# Patient Record
Sex: Female | Born: 1995 | Race: White | Hispanic: No | Marital: Single | Smoking: Never smoker
Health system: Southern US, Community
[De-identification: ages and names within clinical notes are randomized; demographics above are authoritative.]

## PROBLEM LIST (undated history)

## (undated) ENCOUNTER — Inpatient Hospital Stay (HOSPITAL_COMMUNITY): Payer: Self-pay

## (undated) DIAGNOSIS — R011 Cardiac murmur, unspecified: Secondary | ICD-10-CM

## (undated) HISTORY — DX: Cardiac murmur, unspecified: R01.1

## (undated) HISTORY — PX: NO PAST SURGERIES: SHX2092

---
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
## 2017-01-07 ENCOUNTER — Ambulatory Visit (INDEPENDENT_AMBULATORY_CARE_PROVIDER_SITE_OTHER): Payer: Medicaid Other | Admitting: Obstetrics & Gynecology

## 2017-01-07 VITALS — BP 119/72 | HR 97 | Wt 187.0 lb

## 2017-01-07 DIAGNOSIS — Z3402 Encounter for supervision of normal first pregnancy, second trimester: Secondary | ICD-10-CM

## 2017-01-07 DIAGNOSIS — Z23 Encounter for immunization: Secondary | ICD-10-CM | POA: Diagnosis not present

## 2017-01-07 DIAGNOSIS — Z3403 Encounter for supervision of normal first pregnancy, third trimester: Secondary | ICD-10-CM

## 2017-01-07 NOTE — Progress Notes (Signed)
   PRENATAL VISIT NOTE  Subjective:  Stephanie Simon is a 21 y.o. G1P0 at [redacted]w[redacted]d being seen today for ongoing prenatal care.  She is currently monitored for the following issues for this low-risk pregnancy and has Encounter for supervision of normal first pregnancy; Late prenatal care affecting pregnancy in second trimester; and Supervision of normal first teen pregnancy on her problem list.  Patient reports no complaints.  Contractions: Not present. Vag. Bleeding: None.  Movement: Present. Denies leaking of fluid.   The following portions of the patient's history were reviewed and updated as appropriate: allergies, current medications, past family history, past medical history, past social history, past surgical history and problem list. Problem list updated.  Objective:   Vitals:   01/07/17 0857  BP: 119/72  Pulse: 97  Weight: 187 lb (84.8 kg)    Fetal Status: Fetal Heart Rate (bpm): 145   Movement: Present     General:  Alert, oriented and cooperative. Patient is in no acute distress.  Skin: Skin is warm and dry. No rash noted.   Cardiovascular: Normal heart rate noted  Respiratory: Normal respiratory effort, no problems with respiration noted  Abdomen: Soft, gravid, appropriate for gestational age.  Pain/Pressure: Present     Pelvic: Cervical exam deferred        Extremities: Normal range of motion.  Edema: None  Mental Status:  Normal mood and affect. Normal behavior. Normal judgment and thought content.   Assessment and Plan:  Pregnancy: G1P0 at [redacted]w[redacted]d  1. Needs flu shot  - Flu Vaccine QUAD 36+ mos IM  2. Supervision of normal first teen pregnancy in second trimester   3. Encounter for supervision of normal first pregnancy in third trimester   Preterm labor symptoms and general obstetric precautions including but not limited to vaginal bleeding, contractions, leaking of fluid and fetal movement were reviewed in detail with the patient. Please refer to After Visit Summary  for other counseling recommendations.  Return in about 2 weeks (around 01/21/2017).   Allie Bossier, MD

## 2017-02-04 ENCOUNTER — Other Ambulatory Visit (HOSPITAL_COMMUNITY)
Admission: RE | Admit: 2017-02-04 | Discharge: 2017-02-04 | Disposition: A | Discharge status: Home / Self Care | Payer: Medicaid Other | Source: Ambulatory Visit | Attending: Family Medicine | Admitting: Family Medicine

## 2017-02-04 ENCOUNTER — Ambulatory Visit (INDEPENDENT_AMBULATORY_CARE_PROVIDER_SITE_OTHER): Payer: Medicaid Other | Admitting: Family Medicine

## 2017-02-04 VITALS — BP 120/80 | HR 108 | Wt 195.0 lb

## 2017-02-04 DIAGNOSIS — Z3403 Encounter for supervision of normal first pregnancy, third trimester: Secondary | ICD-10-CM | POA: Insufficient documentation

## 2017-02-04 LAB — OB RESULTS CONSOLE GBS: STREP GROUP B AG: NEGATIVE

## 2017-02-04 LAB — OB RESULTS CONSOLE GC/CHLAMYDIA: GC PROBE AMP, GENITAL: NEGATIVE

## 2017-02-04 NOTE — Patient Instructions (Signed)
Breastfeeding Deciding to breastfeed is one of the best choices you can make for you and your baby. A change in hormones during pregnancy causes your breast tissue to grow and increases the number and size of your milk ducts. These hormones also allow proteins, sugars, and fats from your blood supply to make breast milk in your milk-producing glands. Hormones prevent breast milk from being released before your baby is born as well as prompt milk flow after birth. Once breastfeeding has begun, thoughts of your baby, as well as his or her sucking or crying, can stimulate the release of milk from your milk-producing glands. Benefits of breastfeeding For Your Baby  Your first milk (colostrum) helps your baby's digestive system function better.  There are antibodies in your milk that help your baby fight off infections.  Your baby has a lower incidence of asthma, allergies, and sudden infant death syndrome.  The nutrients in breast milk are better for your baby than infant formulas and are designed uniquely for your baby's needs.  Breast milk improves your baby's brain development.  Your baby is less likely to develop other conditions, such as childhood obesity, asthma, or type 2 diabetes mellitus.  For You  Breastfeeding helps to create a very special bond between you and your baby.  Breastfeeding is convenient. Breast milk is always available at the correct temperature and costs nothing.  Breastfeeding helps to burn calories and helps you lose the weight gained during pregnancy.  Breastfeeding makes your uterus contract to its prepregnancy size faster and slows bleeding (lochia) after you give birth.  Breastfeeding helps to lower your risk of developing type 2 diabetes mellitus, osteoporosis, and breast or ovarian cancer later in life.  Signs that your baby is hungry Early Signs of Hunger  Increased alertness or activity.  Stretching.  Movement of the head from side to  side.  Movement of the head and opening of the mouth when the corner of the mouth or cheek is stroked (rooting).  Increased sucking sounds, smacking lips, cooing, sighing, or squeaking.  Hand-to-mouth movements.  Increased sucking of fingers or hands.  Late Signs of Hunger  Fussing.  Intermittent crying.  Extreme Signs of Hunger Signs of extreme hunger will require calming and consoling before your baby will be able to breastfeed successfully. Do not wait for the following signs of extreme hunger to occur before you initiate breastfeeding:  Restlessness.  A loud, strong cry.  Screaming.  Breastfeeding basics Breastfeeding Initiation  Find a comfortable place to sit or lie down, with your neck and back well supported.  Place a pillow or rolled up blanket under your baby to bring him or her to the level of your breast (if you are seated). Nursing pillows are specially designed to help support your arms and your baby while you breastfeed.  Make sure that your baby's abdomen is facing your abdomen.  Gently massage your breast. With your fingertips, massage from your chest wall toward your nipple in a circular motion. This encourages milk flow. You may need to continue this action during the feeding if your milk flows slowly.  Support your breast with 4 fingers underneath and your thumb above your nipple. Make sure your fingers are well away from your nipple and your baby's mouth.  Stroke your baby's lips gently with your finger or nipple.  When your baby's mouth is open wide enough, quickly bring your baby to your breast, placing your entire nipple and as much of the colored area   around your nipple (areola) as possible into your baby's mouth. ? More areola should be visible above your baby's upper lip than below the lower lip. ? Your baby's tongue should be between his or her lower gum and your breast.  Ensure that your baby's mouth is correctly positioned around your nipple  (latched). Your baby's lips should create a seal on your breast and be turned out (everted).  It is common for your baby to suck about 2-3 minutes in order to start the flow of breast milk.  Latching Teaching your baby how to latch on to your breast properly is very important. An improper latch can cause nipple pain and decreased milk supply for you and poor weight gain in your baby. Also, if your baby is not latched onto your nipple properly, he or she may swallow some air during feeding. This can make your baby fussy. Burping your baby when you switch breasts during the feeding can help to get rid of the air. However, teaching your baby to latch on properly is still the best way to prevent fussiness from swallowing air while breastfeeding. Signs that your baby has successfully latched on to your nipple:  Silent tugging or silent sucking, without causing you pain.  Swallowing heard between every 3-4 sucks.  Muscle movement above and in front of his or her ears while sucking.  Signs that your baby has not successfully latched on to nipple:  Sucking sounds or smacking sounds from your baby while breastfeeding.  Nipple pain.  If you think your baby has not latched on correctly, slip your finger into the corner of your baby's mouth to break the suction and place it between your baby's gums. Attempt breastfeeding initiation again. Signs of Successful Breastfeeding Signs from your baby:  A gradual decrease in the number of sucks or complete cessation of sucking.  Falling asleep.  Relaxation of his or her body.  Retention of a small amount of milk in his or her mouth.  Letting go of your breast by himself or herself.  Signs from you:  Breasts that have increased in firmness, weight, and size 1-3 hours after feeding.  Breasts that are softer immediately after breastfeeding.  Increased milk volume, as well as a change in milk consistency and color by the fifth day of  breastfeeding.  Nipples that are not sore, cracked, or bleeding.  Signs That Your Baby is Getting Enough Milk  Wetting at least 1-2 diapers during the first 24 hours after birth.  Wetting at least 5-6 diapers every 24 hours for the first week after birth. The urine should be clear or pale yellow by 5 days after birth.  Wetting 6-8 diapers every 24 hours as your baby continues to grow and develop.  At least 3 stools in a 24-hour period by age 5 days. The stool should be soft and yellow.  At least 3 stools in a 24-hour period by age 7 days. The stool should be seedy and yellow.  No loss of weight greater than 10% of birth weight during the first 3 days of age.  Average weight gain of 4-7 ounces (113-198 g) per week after age 4 days.  Consistent daily weight gain by age 5 days, without weight loss after the age of 2 weeks.  After a feeding, your baby may spit up a small amount. This is common. Breastfeeding frequency and duration Frequent feeding will help you make more milk and can prevent sore nipples and breast engorgement. Breastfeed when   you feel the need to reduce the fullness of your breasts or when your baby shows signs of hunger. This is called "breastfeeding on demand." Avoid introducing a pacifier to your baby while you are working to establish breastfeeding (the first 4-6 weeks after your baby is born). After this time you may choose to use a pacifier. Research has shown that pacifier use during the first year of a baby's life decreases the risk of sudden infant death syndrome (SIDS). Allow your baby to feed on each breast as long as he or she wants. Breastfeed until your baby is finished feeding. When your baby unlatches or falls asleep while feeding from the first breast, offer the second breast. Because newborns are often sleepy in the first few weeks of life, you may need to awaken your baby to get him or her to feed. Breastfeeding times will vary from baby to baby. However,  the following rules can serve as a guide to help you ensure that your baby is properly fed:  Newborns (babies 4 weeks of age or younger) may breastfeed every 1-3 hours.  Newborns should not go longer than 3 hours during the day or 5 hours during the night without breastfeeding.  You should breastfeed your baby a minimum of 8 times in a 24-hour period until you begin to introduce solid foods to your baby at around 6 months of age.  Breast milk pumping Pumping and storing breast milk allows you to ensure that your baby is exclusively fed your breast milk, even at times when you are unable to breastfeed. This is especially important if you are going back to work while you are still breastfeeding or when you are not able to be present during feedings. Your lactation consultant can give you guidelines on how long it is safe to store breast milk. A breast pump is a machine that allows you to pump milk from your breast into a sterile bottle. The pumped breast milk can then be stored in a refrigerator or freezer. Some breast pumps are operated by hand, while others use electricity. Ask your lactation consultant which type will work best for you. Breast pumps can be purchased, but some hospitals and breastfeeding support groups lease breast pumps on a monthly basis. A lactation consultant can teach you how to hand express breast milk, if you prefer not to use a pump. Caring for your breasts while you breastfeed Nipples can become dry, cracked, and sore while breastfeeding. The following recommendations can help keep your breasts moisturized and healthy:  Avoid using soap on your nipples.  Wear a supportive bra. Although not required, special nursing bras and tank tops are designed to allow access to your breasts for breastfeeding without taking off your entire bra or top. Avoid wearing underwire-style bras or extremely tight bras.  Air dry your nipples for 3-4minutes after each feeding.  Use only cotton  bra pads to absorb leaked breast milk. Leaking of breast milk between feedings is normal.  Use lanolin on your nipples after breastfeeding. Lanolin helps to maintain your skin's normal moisture barrier. If you use pure lanolin, you do not need to wash it off before feeding your baby again. Pure lanolin is not toxic to your baby. You may also hand express a few drops of breast milk and gently massage that milk into your nipples and allow the milk to air dry.  In the first few weeks after giving birth, some women experience extremely full breasts (engorgement). Engorgement can make your   breasts feel heavy, warm, and tender to the touch. Engorgement peaks within 3-5 days after you give birth. The following recommendations can help ease engorgement:  Completely empty your breasts while breastfeeding or pumping. You may want to start by applying warm, moist heat (in the shower or with warm water-soaked hand towels) just before feeding or pumping. This increases circulation and helps the milk flow. If your baby does not completely empty your breasts while breastfeeding, pump any extra milk after he or she is finished.  Wear a snug bra (nursing or regular) or tank top for 1-2 days to signal your body to slightly decrease milk production.  Apply ice packs to your breasts, unless this is too uncomfortable for you.  Make sure that your baby is latched on and positioned properly while breastfeeding.  If engorgement persists after 48 hours of following these recommendations, contact your health care provider or a lactation consultant. Overall health care recommendations while breastfeeding  Eat healthy foods. Alternate between meals and snacks, eating 3 of each per day. Because what you eat affects your breast milk, some of the foods may make your baby more irritable than usual. Avoid eating these foods if you are sure that they are negatively affecting your baby.  Drink milk, fruit juice, and water to  satisfy your thirst (about 10 glasses a day).  Rest often, relax, and continue to take your prenatal vitamins to prevent fatigue, stress, and anemia.  Continue breast self-awareness checks.  Avoid chewing and smoking tobacco. Chemicals from cigarettes that pass into breast milk and exposure to secondhand smoke may harm your baby.  Avoid alcohol and drug use, including marijuana. Some medicines that may be harmful to your baby can pass through breast milk. It is important to ask your health care provider before taking any medicine, including all over-the-counter and prescription medicine as well as vitamin and herbal supplements. It is possible to become pregnant while breastfeeding. If birth control is desired, ask your health care provider about options that will be safe for your baby. Contact a health care provider if:  You feel like you want to stop breastfeeding or have become frustrated with breastfeeding.  You have painful breasts or nipples.  Your nipples are cracked or bleeding.  Your breasts are red, tender, or warm.  You have a swollen area on either breast.  You have a fever or chills.  You have nausea or vomiting.  You have drainage other than breast milk from your nipples.  Your breasts do not become full before feedings by the fifth day after you give birth.  You feel sad and depressed.  Your baby is too sleepy to eat well.  Your baby is having trouble sleeping.  Your baby is wetting less than 3 diapers in a 24-hour period.  Your baby has less than 3 stools in a 24-hour period.  Your baby's skin or the white part of his or her eyes becomes yellow.  Your baby is not gaining weight by 5 days of age. Get help right away if:  Your baby is overly tired (lethargic) and does not want to wake up and feed.  Your baby develops an unexplained fever. This information is not intended to replace advice given to you by your health care provider. Make sure you discuss  any questions you have with your health care provider. Document Released: 03/31/2005 Document Revised: 09/12/2015 Document Reviewed: 09/22/2012 Elsevier Interactive Patient Education  2017 [employer]  

## 2017-02-04 NOTE — Progress Notes (Signed)
   PRENATAL VISIT NOTE  Subjective:  Stephanie KirschnerMaiya Simon is a 21 y.o. G1P0 at 4756w2d being seen today for ongoing prenatal care.  She is currently monitored for the following issues for this low-risk pregnancy and has Encounter for supervision of normal first pregnancy; Late prenatal care affecting pregnancy in second trimester; and Supervision of normal first teen pregnancy on her problem list.  Patient reports no complaints.  Contractions: Not present.  .  Movement: Present. Denies leaking of fluid.   The following portions of the patient's history were reviewed and updated as appropriate: allergies, current medications, past family history, past medical history, past social history, past surgical history and problem list. Problem list updated.  Objective:   Vitals:   02/04/17 1006  BP: 120/80  Pulse: (!) 108  Weight: 195 lb (88.5 kg)    Fetal Status: Fetal Heart Rate (bpm): 151 Fundal Height: 37 cm Movement: Present  Presentation: Vertex  General:  Alert, oriented and cooperative. Patient is in no acute distress.  Skin: Skin is warm and dry. No rash noted.   Cardiovascular: Normal heart rate noted  Respiratory: Normal respiratory effort, no problems with respiration noted  Abdomen: Soft, gravid, appropriate for gestational age.  Pain/Pressure: Absent     Pelvic: Cervical exam performed Dilation: Closed Effacement (%): 50 Station: -2  Extremities: Normal range of motion.  Edema: None  Mental Status:  Normal mood and affect. Normal behavior. Normal judgment and thought content.   Assessment and Plan:  Pregnancy: G1P0 at 5756w2d  1. Supervision of normal first teen pregnancy in third trimester Cultures today Labor precautions reviewed - Culture, beta strep (group b only) - GC[facility]Chlamydia [facility]   Term labor symptoms and general obstetric precautions including but not limited to vaginal bleeding, contractions, leaking of fluid and fetal movement were reviewed in  detail with the patient. Please refer to After Visit Summary for other counseling recommendations.  Return in 1 week (on 02/11/2017).   Reva Boresanya S Nichael Ehly, MD

## 2017-02-05 LAB — GC/CHLAMYDIA PROBE AMP (~~LOC~~) NOT AT ARMC
CHLAMYDIA, DNA PROBE: NEGATIVE
Neisseria Gonorrhea: NEGATIVE

## 2017-02-08 LAB — CULTURE, BETA STREP (GROUP B ONLY): STREP GP B CULTURE: NEGATIVE

## 2017-02-11 ENCOUNTER — Ambulatory Visit (INDEPENDENT_AMBULATORY_CARE_PROVIDER_SITE_OTHER): Payer: Medicaid Other | Admitting: Family Medicine

## 2017-02-11 VITALS — BP 109/70 | HR 97 | Wt 199.4 lb

## 2017-02-11 DIAGNOSIS — O0933 Supervision of pregnancy with insufficient antenatal care, third trimester: Secondary | ICD-10-CM

## 2017-02-11 DIAGNOSIS — Z3403 Encounter for supervision of normal first pregnancy, third trimester: Secondary | ICD-10-CM

## 2017-02-11 DIAGNOSIS — O0932 Supervision of pregnancy with insufficient antenatal care, second trimester: Secondary | ICD-10-CM

## 2017-02-11 NOTE — Progress Notes (Signed)
   PRENATAL VISIT NOTE  Subjective:  Stephanie Simon is a 21 y.o. G1P0 at 6439w2d being seen today for ongoing prenatal care.  She is currently monitored for the following issues for this low-risk pregnancy and has Encounter for supervision of normal first pregnancy; Late prenatal care affecting pregnancy in second trimester; and Supervision of normal first teen pregnancy on her problem list.  Patient reports no complaints.  Contractions: Irritability. Vag. Bleeding: None.  Movement: Present. Denies leaking of fluid.   The following portions of the patient's history were reviewed and updated as appropriate: allergies, current medications, past family history, past medical history, past social history, past surgical history and problem list. Problem list updated.  Objective:   Vitals:   02/11/17 1346  BP: 109/70  Pulse: 97  Weight: 199 lb 6.4 oz (90.4 kg)    Fetal Status: Fetal Heart Rate (bpm): 135 Fundal Height: 39 cm Movement: Present  Presentation: Vertex  General:  Alert, oriented and cooperative. Patient is in no acute distress.  Skin: Skin is warm and dry. No rash noted.   Cardiovascular: Normal heart rate noted  Respiratory: Normal respiratory effort, no problems with respiration noted  Abdomen: Soft, gravid, appropriate for gestational age.  Pain/Pressure: Present     Pelvic: Cervical exam deferred Dilation: Fingertip Effacement (%): 50 Station: -2  Extremities: Normal range of motion.  Edema: None  Mental Status:  Normal mood and affect. Normal behavior. Normal judgment and thought content.   Assessment and Plan:  Pregnancy: G1P0 at 8139w2d  1. Late prenatal care affecting pregnancy in second trimester  2. Supervision of normal first teen pregnancy in third trimester UTD Reviewed breastfeeding and labor   Term labor symptoms and general obstetric precautions including but not limited to vaginal bleeding, contractions, leaking of fluid and fetal movement were reviewed in  detail with the patient. Please refer to After Visit Summary for other counseling recommendations.  Return in about 1 week (around 02/18/2017) for Routine prenatal care.   Federico FlakeKimberly Niles [place], MD

## 2017-02-18 ENCOUNTER — Encounter: Payer: Self-pay | Admitting: Nurse Practitioner

## 2017-02-18 ENCOUNTER — Ambulatory Visit (INDEPENDENT_AMBULATORY_CARE_PROVIDER_SITE_OTHER): Payer: Medicaid Other | Admitting: Nurse Practitioner

## 2017-02-18 VITALS — BP 101/69 | HR 93 | Wt 202.0 lb

## 2017-02-18 DIAGNOSIS — Z3403 Encounter for supervision of normal first pregnancy, third trimester: Secondary | ICD-10-CM

## 2017-02-18 NOTE — Patient Instructions (Addendum)
Return in one week.  Braxton Hicks Contractions Contractions of the uterus can occur throughout pregnancy, but they are not always a sign that you are in labor. You may have practice contractions called Braxton Hicks contractions. These false labor contractions are sometimes confused with true labor. What are Braxton Hicks contractions? Braxton Hicks contractions are tightening movements that occur in the muscles of the uterus before labor. Unlike true labor contractions, these contractions do not result in opening (dilation) and thinning of the cervix. Toward the end of pregnancy (32-34 weeks), Braxton Hicks contractions can happen more often and may become stronger. These contractions are sometimes difficult to tell apart from true labor because they can be very uncomfortable. You should not feel embarrassed if you go to the hospital with false labor. Sometimes, the only way to tell if you are in true labor is for your health care provider to look for changes in the cervix. The health care provider will do a physical exam and may monitor your contractions. If you are not in true labor, the exam should show that your cervix is not dilating and your water has not broken. If there are no prenatal problems or other health problems associated with your pregnancy, it is completely safe for you to be sent home with false labor. You may continue to have Braxton Hicks contractions until you go into true labor. How can I tell the difference between true labor and false labor?  Differences ? False labor ? Contractions last 30-70 seconds.: Contractions are usually shorter and not as strong as true labor contractions. ? Contractions become very regular.: Contractions are usually irregular. ? Discomfort is usually felt in the top of the uterus, and it spreads to the lower abdomen and low back.: Contractions are often felt in the front of the lower abdomen and in the groin. ? Contractions do not go away with  walking.: Contractions may go away when you walk around or change positions while lying down. ? Contractions usually become more intense and increase in frequency.: Contractions get weaker and are shorter-lasting as time goes on. ? The cervix dilates and gets thinner.: The cervix usually does not dilate or become thin. Follow these instructions at home:  Take over-the-counter and prescription medicines only as told by your health care provider.  Keep up with your usual exercises and follow other instructions from your health care provider.  Eat and drink lightly if you think you are going into labor.  If Braxton Hicks contractions are making you uncomfortable: ? Change your position from lying down or resting to walking, or change from walking to resting. ? Sit and rest in a tub of warm water. ? Drink enough fluid to keep your urine clear or pale yellow. Dehydration may cause these contractions. ? Do slow and deep breathing several times an hour.  Keep all follow-up prenatal visits as told by your health care provider. This is important. Contact a health care provider if:  You have a fever.  You have continuous pain in your abdomen. Get help right away if:  Your contractions become stronger, more regular, and closer together.  You have fluid leaking or gushing from your vagina.  You pass blood-tinged mucus (bloody show).  You have bleeding from your vagina.  You have low back pain that you never had before.  You feel your baby's head pushing down and causing pelvic pressure.  Your baby is not moving inside you as much as it used to. Summary  Contractions   that occur before labor are called Braxton Hicks contractions, false labor, or practice contractions.  Braxton Hicks contractions are usually shorter, weaker, farther apart, and less regular than true labor contractions. True labor contractions usually become progressively stronger and regular and they become more  frequent.  Manage discomfort from Braxton Hicks contractions by changing position, resting in a warm bath, drinking plenty of water, or practicing deep breathing. This information is not intended to replace advice given to you by your health care provider. Make sure you discuss any questions you have with your health care provider. Document Released: 03/31/2005 Document Revised: 02/18/2016 Document Reviewed: 02/18/2016 Elsevier Interactive Patient Education  2017 [employer]  

## 2017-02-18 NOTE — Progress Notes (Signed)
    Subjective:  Stephanie KirschnerMaiya Simon is a 21 y.o. G1P0 at 2552w2d being seen today for ongoing prenatal care.  She is currently monitored for the following issues for this low-risk pregnancy and has Encounter for supervision of normal first pregnancy; Late prenatal care affecting pregnancy in second trimester; and Supervision of normal first teen pregnancy on their problem list.  Patient reports no complaints.  Contractions: Irritability. Vag. Bleeding: None.  Movement: Present. Denies leaking of fluid.   The following portions of the patient's history were reviewed and updated as appropriate: allergies, current medications, past family history, past medical history, past social history, past surgical history and problem list. Problem list updated.  Objective:   Vitals:   02/18/17 1440  BP: 101/69  Pulse: 93  Weight: 202 lb (91.6 kg)    Fetal Status: Fetal Heart Rate (bpm): 137   Movement: Present     General:  Alert, oriented and cooperative. Patient is in no acute distress.  Skin: Skin is warm and dry. No rash noted.   Cardiovascular: Normal heart rate noted  Respiratory: Normal respiratory effort, no problems with respiration noted  Abdomen: Soft, gravid, appropriate for gestational age. Pain/Pressure: Present   Fundus 40 cm  Pelvic:  Cervical exam performed      Cervix is closed and thick, vertex position at -2 station  Extremities: Normal range of motion.  Edema: Mild pitting, slight indentation  Mental Status: Normal mood and affect. Normal behavior. Normal judgment and thought content.     Assessment and Plan:  Pregnancy: G1P0 at 7852w2d with mild ankle edema  Patient Active Problem List   Diagnosis Date Noted  . Supervision of normal first teen pregnancy 12/10/2016  . Encounter for supervision of normal first pregnancy 09/12/2016  . Late prenatal care affecting pregnancy in second trimester 09/12/2016    Term labor symptoms and general obstetric precautions including but not  limited to vaginal bleeding, contractions, leaking of fluid and fetal movement were reviewed in detail with the patient. Please refer to After Visit Summary for other counseling recommendations.  Return in about 1 week (around 02/25/2017).  Return if develops headaches, blurred vision or worse edema. Discuss pediatrician and postpartum contraception at next visit.  Nolene BernheimERRI BURLESON, RN, MSN, NP-BC Nurse Practitioner, [facility], [facility] 02/18/2017 2:47 PM

## 2017-02-25 ENCOUNTER — Encounter: Payer: Self-pay | Admitting: Family Medicine

## 2017-02-25 ENCOUNTER — Ambulatory Visit (INDEPENDENT_AMBULATORY_CARE_PROVIDER_SITE_OTHER): Payer: Medicaid Other | Admitting: Family Medicine

## 2017-02-25 VITALS — BP 119/76 | HR 97 | Wt 204.0 lb

## 2017-02-25 DIAGNOSIS — Z3403 Encounter for supervision of normal first pregnancy, third trimester: Secondary | ICD-10-CM

## 2017-02-25 DIAGNOSIS — O0932 Supervision of pregnancy with insufficient antenatal care, second trimester: Secondary | ICD-10-CM

## 2017-02-25 NOTE — Progress Notes (Signed)
   PRENATAL VISIT NOTE  Subjective:  Stephanie Simon is a 21 y.o. G1P0 at 5828w2d being seen today for ongoing prenatal care.  She is currently monitored for the following issues for this low-risk pregnancy and has Encounter for supervision of normal first pregnancy; Late prenatal care affecting pregnancy in second trimester; and Supervision of normal first teen pregnancy on their problem list.  Patient reports cramping.  Contractions: Irritability. Vag. Bleeding: None.  Movement: Present. Denies leaking of fluid.   The following portions of the patient's history were reviewed and updated as appropriate: allergies, current medications, past family history, past medical history, past social history, past surgical history and problem list. Problem list updated.  Objective:   Vitals:   02/25/17 1354  BP: 119/76  Pulse: 97  Weight: 204 lb (92.5 kg)    Fetal Status: Fetal Heart Rate (bpm): 140 Fundal Height: 40 cm Movement: Present  Presentation: Vertex  General:  Alert, oriented and cooperative. Patient is in no acute distress.  Skin: Skin is warm and dry. No rash noted.   Cardiovascular: Normal heart rate noted  Respiratory: Normal respiratory effort, no problems with respiration noted  Abdomen: Soft, gravid, appropriate for gestational age.  Pain/Pressure: Present     Pelvic: Cervical exam performed Dilation: 1 Effacement (%): 50 Station: -2  Extremities: Normal range of motion.  Edema: Mild pitting, slight indentation  Mental Status:  Normal mood and affect. Normal behavior. Normal judgment and thought content.   Assessment and Plan:  Pregnancy: G1P0 at 2628w2d  1. Encounter for supervision of normal first pregnancy in third trimester UTD Will request PD IOL today Performed membrane sweeping with plans to repeat next week. Reviewed that ctx, spotting are normal after membrane sweeping Reviewed labor s/sx  2. Late prenatal care affecting pregnancy in second trimester  3. Supervision of  normal first teen pregnancy in third trimester  Term labor symptoms and general obstetric precautions including but not limited to vaginal bleeding, contractions, leaking of fluid and fetal movement were reviewed in detail with the patient. Please refer to After Visit Summary for other counseling recommendations.  Return in about 1 week (around 03/04/2017) for Routine prenatal care.   Federico FlakeKimberly Niles Marshall Roehrich, MD

## 2017-02-26 ENCOUNTER — Telehealth (HOSPITAL_COMMUNITY): Payer: Self-pay | Admitting: *Deleted

## 2017-02-26 NOTE — Telephone Encounter (Signed)
Preadmission screen  

## 2017-03-04 ENCOUNTER — Other Ambulatory Visit: Payer: Self-pay

## 2017-03-04 ENCOUNTER — Encounter (HOSPITAL_COMMUNITY): Payer: Self-pay | Admitting: *Deleted

## 2017-03-04 ENCOUNTER — Inpatient Hospital Stay (HOSPITAL_COMMUNITY): Payer: Medicaid Other

## 2017-03-04 ENCOUNTER — Inpatient Hospital Stay (HOSPITAL_COMMUNITY)
Admission: AD | Admit: 2017-03-04 | Discharge: 2017-03-04 | Disposition: A | Discharge status: Home / Self Care | Payer: Medicaid Other | Source: Ambulatory Visit | Attending: Obstetrics and Gynecology | Admitting: Obstetrics and Gynecology

## 2017-03-04 ENCOUNTER — Encounter: Payer: Self-pay | Admitting: Obstetrics and Gynecology

## 2017-03-04 ENCOUNTER — Ambulatory Visit (INDEPENDENT_AMBULATORY_CARE_PROVIDER_SITE_OTHER): Payer: Medicaid Other | Admitting: Obstetrics and Gynecology

## 2017-03-04 VITALS — BP 136/67 | HR 86 | Wt 203.0 lb

## 2017-03-04 DIAGNOSIS — Z3403 Encounter for supervision of normal first pregnancy, third trimester: Secondary | ICD-10-CM

## 2017-03-04 DIAGNOSIS — Z809 Family history of malignant neoplasm, unspecified: Secondary | ICD-10-CM | POA: Insufficient documentation

## 2017-03-04 DIAGNOSIS — Z833 Family history of diabetes mellitus: Secondary | ICD-10-CM | POA: Insufficient documentation

## 2017-03-04 DIAGNOSIS — O289 Unspecified abnormal findings on antenatal screening of mother: Secondary | ICD-10-CM | POA: Insufficient documentation

## 2017-03-04 DIAGNOSIS — Z8261 Family history of arthritis: Secondary | ICD-10-CM | POA: Insufficient documentation

## 2017-03-04 DIAGNOSIS — O480 Post-term pregnancy: Secondary | ICD-10-CM | POA: Diagnosis not present

## 2017-03-04 DIAGNOSIS — Z79899 Other long term (current) drug therapy: Secondary | ICD-10-CM | POA: Diagnosis not present

## 2017-03-04 DIAGNOSIS — O0932 Supervision of pregnancy with insufficient antenatal care, second trimester: Secondary | ICD-10-CM | POA: Diagnosis not present

## 2017-03-04 DIAGNOSIS — Z832 Family history of diseases of the blood and blood-forming organs and certain disorders involving the immune mechanism: Secondary | ICD-10-CM | POA: Diagnosis not present

## 2017-03-04 DIAGNOSIS — Z3A40 40 weeks gestation of pregnancy: Secondary | ICD-10-CM | POA: Diagnosis not present

## 2017-03-04 NOTE — MAU Note (Signed)
Returned from US, no complaints

## 2017-03-04 NOTE — Progress Notes (Signed)
   PRENATAL VISIT NOTE  Subjective:  Elmon KirschnerMaiya Collison is a 21 y.o. G1P0 at 2518w2d being seen today for ongoing prenatal care.  She is currently monitored for the following issues for this low-risk pregnancy and has Encounter for supervision of normal first pregnancy; Late prenatal care affecting pregnancy in second trimester; and Supervision of normal first teen pregnancy on their problem list.  Patient reports no complaints.  Contractions: Irregular. Vag. Bleeding: None.  Movement: Present. Denies leaking of fluid.   The following portions of the patient's history were reviewed and updated as appropriate: allergies, current medications, past family history, past medical history, past social history, past surgical history and problem list. Problem list updated.  Objective:   Vitals:   03/04/17 1437  BP: 136/67  Pulse: 86  Weight: 203 lb (92.1 kg)    Fetal Status: Fetal Heart Rate (bpm): NST   Movement: Present     General:  Alert, oriented and cooperative. Patient is in no acute distress.  Skin: Skin is warm and dry. No rash noted.   Cardiovascular: Normal heart rate noted  Respiratory: Normal respiratory effort, no problems with respiration noted  Abdomen: Soft, gravid, appropriate for gestational age.  Pain/Pressure: Present     Pelvic: Cervical exam deferred        Extremities: Normal range of motion.  Edema: Mild pitting, slight indentation  Mental Status:  Normal mood and affect. Normal behavior. Normal judgment and thought content.   Assessment and Plan:  Pregnancy: G1P0 at 6118w2d  1. Encounter for supervision of normal first pregnancy in third trimester Patient is doing well without complaints Patient sent to MAU due to non-reactive NST with baseline 145, mod variability, + variable decels,  - Fetal nonstress test- baseline   2. Late prenatal care affecting pregnancy in second trimester   Term labor symptoms and general obstetric precautions including but not limited to  vaginal bleeding, contractions, leaking of fluid and fetal movement were reviewed in detail with the patient. Please refer to After Visit Summary for other counseling recommendations.  No Follow-up on file.   Catalina AntiguaPeggy Shaylene Paganelli, MD

## 2017-03-04 NOTE — MAU Provider Note (Signed)
History     CSN: [id number]  Arrival date and time: 03/04/17 1614   None     No chief complaint on file.  21 yo G1P0 at 4936w2d presents today after being seen in the office. NST at that time was non-reactive. Patient has no complaints today.     OB History    Gravida Para Term Preterm AB Living   1             SAB TAB Ectopic Multiple Live Births                  Past Medical History:  Diagnosis Date  . Cardiac murmur 03/28/2013   ECHO 05/13/2013 NORMAL ECHO.  Benign Flow murmur. Heard best in the aortic space and LEFT carotid.  Increases with increased pressure.     Past Surgical History:  Procedure Laterality Date  . WISDOM TOOTH EXTRACTION  06/2015    Family History  Problem Relation Age of Onset  . Cancer Paternal Aunt   . Anemia Mother   . Diabetes Father   . Arthritis Father        gout  . Anemia Father     Social History   Tobacco Use  . Smoking status: Never Smoker  . Smokeless tobacco: Never Used  Substance Use Topics  . Alcohol use: No  . Drug use: No    Allergies: No Known Allergies  Medications Prior to Admission  Medication Sig Dispense Refill Last Dose  . Doxylamine-Pyridoxine (DICLEGIS) 10-10 MG TBEC Take 2 tablets by mouth at bedtime. If symptoms persist, add one tablet in the morning and one in the afternoon 120 tablet 2 Taking  . flintstones complete (FLINTSTONES) 60 MG chewable tablet Chew 1 tablet by mouth daily.   Taking  . ranitidine (ZANTAC) 150 MG capsule Take 1 capsule (150 mg total) by mouth 2 (two) times daily. 60 capsule 3 Taking    Review of Systems  Constitutional: Negative for chills and fever.  HENT: Negative for congestion and ear discharge.   Eyes: Negative for discharge and itching.  Respiratory: Negative for apnea and chest tightness.   Cardiovascular: Negative for chest pain and leg swelling.  Gastrointestinal: Negative for abdominal pain and nausea.  Endocrine: Negative for cold intolerance and heat  intolerance.  Genitourinary: Negative for dysuria and vaginal bleeding.  Musculoskeletal: Negative for arthralgias and back pain.  Neurological: Negative for dizziness and headaches.  Psychiatric/Behavioral: Negative for agitation and confusion.   Physical Exam   Last menstrual period 05/26/2016.  Physical Exam  Constitutional: She is oriented to person, place, and time. She appears well-developed and well-nourished.  HENT:  Head: Normocephalic and atraumatic.  Eyes: Conjunctivae are normal. Pupils are equal, round, and reactive to light.  Neck: Normal range of motion. Neck supple.  Cardiovascular: Normal rate and intact distal pulses.  Respiratory: Effort normal. No respiratory distress.  GI: Soft. There is no tenderness.  Musculoskeletal: Normal range of motion. She exhibits no edema.  Neurological: She is alert and oriented to person, place, and time.  Skin: Skin is warm and dry.  Psychiatric: She has a normal mood and affect. Her behavior is normal.    MAU Course  Procedures EFM: 130 bpm/mod var/pos acel/no decels-cat 1  MDM Will get BPP because nonreactive NST in office. NST reactive on admission.  BPP 8/8  Assessment and Plan  1. Pregnancy at 40 weeks completed gestation- discharge home in stable condition. Given labor precautions. Induction scheduled for Monday.  Hospital doctorAmber  Landyn Lorincz 03/04/2017, 5:40 PM

## 2017-03-04 NOTE — MAU Note (Addendum)
NR NST in office, sent for further eval.  Denies bleeding, leaking or pain. Was 1 cm when last checked

## 2017-03-04 NOTE — Discharge Instructions (Signed)
Fetal Movement Counts  Patient Name: ________________________________________________ Patient Due Date: ____________________  What is a fetal movement count?  A fetal movement count is the number of times that you feel your baby move during a certain amount of time. This may also be called a fetal kick count. A fetal movement count is recommended for every pregnant woman. You may be asked to start counting fetal movements as early as week 28 of your pregnancy.  Pay attention to when your baby is most active. You may notice your baby's sleep and wake cycles. You may also notice things that make your baby move more. You should do a fetal movement count:  · When your baby is normally most active.  · At the same time each day.    A good time to count movements is while you are resting, after having something to eat and drink.  How do I count fetal movements?  1. Find a quiet, comfortable area. Sit, or lie down on your side.  2. Write down the date, the start time and stop time, and the number of movements that you felt between those two times. Take this information with you to your health care visits.  3. For 2 hours, count kicks, flutters, swishes, rolls, and jabs. You should feel at least 10 movements during 2 hours.  4. You may stop counting after you have felt 10 movements.  5. If you do not feel 10 movements in 2 hours, have something to eat and drink. Then, keep resting and counting for 1 hour. If you feel at least 4 movements during that hour, you may stop counting.  Contact a health care provider if:  · You feel fewer than 4 movements in 2 hours.  · Your baby is not moving like he or she usually does.  Date: ____________ Start time: ____________ Stop time: ____________ Movements: ____________  Date: ____________ Start time: ____________ Stop time: ____________ Movements: ____________  Date: ____________ Start time: ____________ Stop time: ____________ Movements: ____________   Date: ____________ Start time: ____________ Stop time: ____________ Movements: ____________  Date: ____________ Start time: ____________ Stop time: ____________ Movements: ____________  Date: ____________ Start time: ____________ Stop time: ____________ Movements: ____________  Date: ____________ Start time: ____________ Stop time: ____________ Movements: ____________  Date: ____________ Start time: ____________ Stop time: ____________ Movements: ____________  Date: ____________ Start time: ____________ Stop time: ____________ Movements: ____________  This information is not intended to replace advice given to you by your health care provider. Make sure you discuss any questions you have with your health care provider.  Document Released: 04/30/2006 Document Revised: 11/28/2015 Document Reviewed: 05/10/2015  Elsevier Interactive Patient Education © 2018 Else[employer]    Labor Induction  Labor induction is when steps are taken to cause a pregnant woman to begin the labor process. Most women go into labor on their own between 37 weeks and 42 weeks of the pregnancy. When this does not happen or when there is a medical need, methods may be used to induce labor. Labor induction causes a pregnant woman's uterus to contract. It also causes the cervix to soften (ripen), open (dilate), and thin out (efface). Usually, labor is not induced before 39 weeks of the pregnancy unless there is a problem with the baby or mother.  Before inducing labor, your health care provider will consider a number of factors, including the following:  · The medical condition of you and the baby.  · How many weeks along you are.  ·   The status of the baby’s lung maturity.  · The condition of the cervix.  · The position of the baby.    What are the reasons for labor induction?  Labor may be induced for the following reasons:  · The health of the baby or mother is at risk.  · The pregnancy is overdue by 1 week or more.   · The water breaks but labor does not start on its own.  · The mother has a health condition or serious illness, such as high blood pressure, infection, placental abruption, or diabetes.  · The amniotic fluid amounts are low around the baby.  · The baby is distressed.    Convenience or wanting the baby to be born on a certain date is not a reason for inducing labor.  What methods are used for labor induction?  Several methods of labor induction may be used, such as:  · Prostaglandin medicine. This medicine causes the cervix to dilate and ripen. The medicine will also start contractions. It can be taken by mouth or by inserting a suppository into the vagina.  · Inserting a thin tube (catheter) with a balloon on the end into the vagina to dilate the cervix. Once inserted, the balloon is expanded with water, which causes the cervix to open.  · Stripping the membranes. Your health care provider separates amniotic sac tissue from the cervix, causing the cervix to be stretched and causing the release of a hormone called progesterone. This may cause the uterus to contract. It is often done during an office visit. You will be sent home to wait for the contractions to begin. You will then come in for an induction.  · Breaking the water. Your health care provider makes a hole in the amniotic sac using a small instrument. Once the amniotic sac breaks, contractions should begin. This may still take hours to see an effect.  · Medicine to trigger or strengthen contractions. This medicine is given through an IV access tube inserted into a vein in your arm.    All of the methods of induction, besides stripping the membranes, will be done in the hospital. Induction is done in the hospital so that you and the baby can be carefully monitored.  How long does it take for labor to be induced?  Some inductions can take up to 2–3 days. Depending on the cervix, it usually takes less time. It takes longer when you are induced early in the  pregnancy or if this is your first pregnancy. If a mother is still pregnant and the induction has been going on for 2–3 days, either the mother will be sent home or a cesarean delivery will be needed.  What are the risks associated with labor induction?  Some of the risks of induction include:  · Changes in fetal heart rate, such as too high, too low, or erratic.  · Fetal distress.  · Chance of infection for the mother and baby.  · Increased chance of having a cesarean delivery.  · Breaking off (abruption) of the placenta from the uterus (rare).  · Uterine rupture (very rare).    When induction is needed for medical reasons, the benefits of induction may outweigh the risks.  What are some reasons for not inducing labor?  Labor induction should not be done if:  · It is shown that your baby does not tolerate labor.  · You have had previous surgeries on your uterus, such as a myomectomy or the removal   of fibroids.  · Your placenta lies very low in the uterus and blocks the opening of the cervix (placenta previa).  · Your baby is not in a head-down position.  · The umbilical cord drops down into the birth canal in front of the baby. This could cut off the baby's blood and oxygen supply.  · You have had a previous cesarean delivery.  · There are unusual circumstances, such as the baby being extremely premature.    This information is not intended to replace advice given to you by your health care provider. Make sure you discuss any questions you have with your health care provider.  Document Released: 08/20/2006 Document Revised: 09/06/2015 Document Reviewed: 10/28/2012  Elsevier Interactive Patient Education © 2017 Elsevier Inc.

## 2017-03-09 ENCOUNTER — Other Ambulatory Visit: Payer: Self-pay

## 2017-03-09 ENCOUNTER — Inpatient Hospital Stay (HOSPITAL_COMMUNITY)
Admission: RE | Admit: 2017-03-09 | Discharge: 2017-03-12 | DRG: 805 | Disposition: A | Discharge status: Home / Self Care | Payer: Medicaid Other | Source: Ambulatory Visit | Attending: Family Medicine | Admitting: Family Medicine

## 2017-03-09 ENCOUNTER — Encounter (HOSPITAL_COMMUNITY): Payer: Self-pay | Admitting: Anesthesiology

## 2017-03-09 ENCOUNTER — Encounter (HOSPITAL_COMMUNITY): Payer: Self-pay

## 2017-03-09 DIAGNOSIS — O480 Post-term pregnancy: Principal | ICD-10-CM | POA: Diagnosis present

## 2017-03-09 DIAGNOSIS — O411030 Infection of amniotic sac and membranes, unspecified, third trimester, not applicable or unspecified: Secondary | ICD-10-CM | POA: Diagnosis present

## 2017-03-09 DIAGNOSIS — Z3490 Encounter for supervision of normal pregnancy, unspecified, unspecified trimester: Secondary | ICD-10-CM | POA: Diagnosis present

## 2017-03-09 DIAGNOSIS — Z3A41 41 weeks gestation of pregnancy: Secondary | ICD-10-CM | POA: Diagnosis not present

## 2017-03-09 LAB — TYPE AND SCREEN
ABO/RH(D): A POS
Antibody Screen: NEGATIVE

## 2017-03-09 LAB — CBC
HEMATOCRIT: 29.3 % — AB (ref 36.0–46.0)
HEMOGLOBIN: 8.8 g/dL — AB (ref 12.0–15.0)
MCH: 20.7 pg — AB (ref 26.0–34.0)
MCHC: 30 g/dL (ref 30.0–36.0)
MCV: 68.9 fL — AB (ref 78.0–100.0)
Platelets: 333 10*3/uL (ref 150–400)
RBC: 4.25 MIL/uL (ref 3.87–5.11)
RDW: 18.3 % — ABNORMAL HIGH (ref 11.5–15.5)
WBC: 11 10*3/uL — ABNORMAL HIGH (ref 4.0–10.5)

## 2017-03-09 LAB — ABO/RH: ABO/RH(D): A POS

## 2017-03-09 MED ORDER — OXYTOCIN 40 UNITS IN LACTATED RINGERS INFUSION - SIMPLE MED: 2.5000 [IU]/h | INTRAVENOUS | Status: DC

## 2017-03-09 MED ORDER — OXYTOCIN 40 UNITS IN LACTATED RINGERS INFUSION - SIMPLE MED
1.0000 m[IU]/min | INTRAVENOUS | Status: DC
  Administered 2017-03-09: 12 m[IU]/min via INTRAVENOUS
  Administered 2017-03-09: 2 m[IU]/min via INTRAVENOUS
  Administered 2017-03-09: 8 m[IU]/min via INTRAVENOUS
  Administered 2017-03-09: 10 m[IU]/min via INTRAVENOUS
  Administered 2017-03-10: 18 m[IU]/min via INTRAVENOUS
  Administered 2017-03-10: 10 m[IU]/min via INTRAVENOUS
  Administered 2017-03-10: 8 m[IU]/min via INTRAVENOUS
  Administered 2017-03-10 (×2): 14 m[IU]/min via INTRAVENOUS
  Filled 2017-03-09 (×2): qty 1000

## 2017-03-09 MED ORDER — MISOPROSTOL 200 MCG PO TABS
50.0000 ug | ORAL_TABLET | ORAL | Status: DC
  Administered 2017-03-09: 50 ug via ORAL
  Filled 2017-03-09: qty 1

## 2017-03-09 MED ORDER — LACTATED RINGERS IV SOLN
500.0000 mL | INTRAVENOUS | Status: DC | PRN
  Administered 2017-03-09: 200 mL via INTRAVENOUS
  Administered 2017-03-09: 300 mL via INTRAVENOUS
  Administered 2017-03-09: 500 mL via INTRAVENOUS
  Administered 2017-03-10: 300 mL via INTRAVENOUS
  Administered 2017-03-10: 1000 mL via INTRAVENOUS
  Administered 2017-03-10: 200 mL via INTRAVENOUS
  Administered 2017-03-10: 500 mL via INTRAVENOUS

## 2017-03-09 MED ORDER — LIDOCAINE HCL (PF) 1 % IJ SOLN
30.0000 mL | INTRAMUSCULAR | Status: AC | PRN
  Administered 2017-03-10 (×2): 30 mL via SUBCUTANEOUS
  Filled 2017-03-09 (×3): qty 30

## 2017-03-09 MED ORDER — FLEET ENEMA 7-19 GM/118ML RE ENEM: 1.0000 | ENEMA | RECTAL | Status: DC | PRN

## 2017-03-09 MED ORDER — MISOPROSTOL 25 MCG QUARTER TABLET
25.0000 ug | ORAL_TABLET | ORAL | Status: DC | PRN
  Administered 2017-03-09: 25 ug via VAGINAL
  Filled 2017-03-09: qty 1

## 2017-03-09 MED ORDER — SOD CITRATE-CITRIC ACID 500-334 MG/5ML PO SOLN: 30.0000 mL | ORAL | Status: DC | PRN

## 2017-03-09 MED ORDER — ONDANSETRON HCL 4 MG/2ML IJ SOLN: 4.0000 mg | Freq: Four times a day (QID) | INTRAMUSCULAR | Status: DC | PRN

## 2017-03-09 MED ORDER — LACTATED RINGERS IV SOLN
INTRAVENOUS | Status: DC
  Administered 2017-03-09 – 2017-03-10 (×8): via INTRAVENOUS

## 2017-03-09 MED ORDER — ACETAMINOPHEN 325 MG PO TABS
650.0000 mg | ORAL_TABLET | ORAL | Status: DC | PRN
  Administered 2017-03-10 (×2): 650 mg via ORAL
  Filled 2017-03-09 (×2): qty 2

## 2017-03-09 MED ORDER — OXYCODONE-ACETAMINOPHEN 5-325 MG PO TABS: 1.0000 | ORAL_TABLET | ORAL | Status: DC | PRN

## 2017-03-09 MED ORDER — TERBUTALINE SULFATE 1 MG/ML IJ SOLN
0.2500 mg | Freq: Once | INTRAMUSCULAR | Status: DC | PRN
  Filled 2017-03-09: qty 1

## 2017-03-09 MED ORDER — OXYCODONE-ACETAMINOPHEN 5-325 MG PO TABS: 2.0000 | ORAL_TABLET | ORAL | Status: DC | PRN

## 2017-03-09 MED ORDER — OXYTOCIN BOLUS FROM INFUSION
500.0000 mL | Freq: Once | INTRAVENOUS | Status: AC
  Administered 2017-03-10: 500 mL via INTRAVENOUS

## 2017-03-09 NOTE — Progress Notes (Signed)
Labor Progress Note Stephanie KirschnerMaiya Simon is a [age] y.o. G1P0 at 7952w0d presented for IOL for postdates  S:  Patient up on birthing ball. Coping well with contractions. Foley still in place  O:  BP (!) 122/57   Pulse 84   Temp 98.1 F (36.7 C) (Oral)   Resp 16   Ht 5\' 2"  (1.575 m)   Wt 205 lb (93 kg)   LMP 05/26/2016   BMI 37.49 kg/m   Fetal Tracing:  Baseline: 140 Variability: moderate Accels: 15x15 Decels: none  Toco: 1-2  CVE: Dilation: 1 Effacement (%): Thick Cervical Position: Middle Station: -2 Presentation: Vertex Exam by:: Cleone Slimaroline Neill, CNM   A&P: 21 y.o. G1P0 5752w0d IOL for postdates #Labor: Plan pitocin when foley out #Pain: n/a #FWB: Cat 1 #GBS negative  Rolm Bookbinderaroline M Neill, CNM 3:21 PM

## 2017-03-09 NOTE — Progress Notes (Signed)
LABOR PROGRESS NOTE  Stephanie KirschnerMaiya Simon is a 21 y.o. G1P0 at 7042w0d  admitted for IOL for postdates  Subjective: Patient is doing well at the moment. No acute complaints.  Objective: BP (!) 115/50   Pulse 100   Temp 98.7 F (37.1 C) (Oral)   Resp 18   Ht 5\' 2"  (1.575 m)   Wt 205 lb (93 kg)   LMP 05/26/2016   BMI 37.49 kg/m  or  Vitals:   03/09/17 2030 03/09/17 2055 03/09/17 2130 03/09/17 2200  BP: (!) 110/55  122/71 (!) 115/50  Pulse: 92  89 100  Resp:      Temp:  98.7 F (37.1 C)    TempSrc:  Oral    Weight:      Height:        Last SVE:1700 Dilation: 5 Effacement (%): 60 Cervical Position: Middle Station: -1 Presentation: Vertex Exam by:: Cleone Slimaroline Neill, CNM  FHT: HR 150, Moderate variability, + accels, no decels Toco: q2-3 min  Labs: Lab Results  Component Value Date   WBC 11.0 (H) 03/09/2017   HGB 8.8 (L) 03/09/2017   HCT 29.3 (L) 03/09/2017   MCV 68.9 (L) 03/09/2017   PLT 333 03/09/2017    Patient Active Problem List   Diagnosis Date Noted  . Encounter for induction of labor 03/09/2017  . Supervision of normal first teen pregnancy 12/10/2016  . Encounter for supervision of normal first pregnancy 09/12/2016  . Late prenatal care affecting pregnancy in second trimester 09/12/2016    Assessment / Plan: 21 y.o. G1P0 at 4642w0d here for IOL for postdates  Labor: On pitocin (8), Foley bulb out Fetal Wellbeing: Cat 1 Pain Control:  Nitrous and IV pain meds, No epidural per patient Anticipated MOD: SVD  Lovena NeighboursAbdoulaye Scotland Dost, MD 03/09/2017, 10:17 PM

## 2017-03-09 NOTE — H&P (Signed)
OBSTETRIC ADMISSION HISTORY AND PHYSICAL  Stephanie Simon is a 21 y.o. female G1P0 with IUP at 7727w0d by LMP presenting for IOL for postdates. She reports +FMs, No LOF, no VB, no blurry vision, headaches or peripheral edema, and RUQ pain.  She plans on breast feeding. She request IUD for birth control. She received her prenatal [facility]  Dating: By LMP --->  Estimated Date of Delivery: 03/02/17   [facility]  Prenatal Labs  Dating 05/26/16 Blood type: A/Positive/-- (06/01 1011) A pos  Genetic Screen  Quad:  neg   N Antibody:Negative (06/01 1011)neg  Anatomic US  neg Rubella: 1.88 (06/01 1011)imm  GTT  Third trimester: normal RPR: Non Reactive (06/01 1011) neg  Flu vaccine  9/18 HBsAg: Negative (06/01 1011) neg  TDaP vaccine  12/10/16                                            HIV:   neg  Baby Food Breast                                         GBS: neg  Contraception IUD Pap: n/a  Circumcision girl   Pediatrician [facility]   Support Person [place]   Prenatal Classes Planning     Prenatal History/Complications:  Past Medical History: Past Medical History:  Diagnosis Date  . Cardiac murmur 03/28/2013   ECHO 05/13/2013 NORMAL ECHO.  Benign Flow murmur. Heard best in the aortic space and LEFT carotid.  Increases with increased pressure.     Past Surgical History: Past Surgical History:  Procedure Laterality Date  . NO PAST SURGERIES    . WISDOM TOOTH EXTRACTION  06/2015    Obstetrical History: OB History    Gravida Para Term Preterm AB Living   1             SAB TAB Ectopic Multiple Live Births                  Social History: Social History   Socioeconomic History  . Marital status: Single    Spouse name: n/a  . Number of children: 0  . Years of education: None  . Highest education level: None  Social Needs  . Financial resource strain: None  . Food insecurity - worry: None  . Food insecurity - inability: None  . Transportation needs  - medical: None  . Transportation needs - non-medical: None  Occupational History  . Occupation: student    Comment: [employer]  Tobacco Use  . Smoking status: Never Smoker  . Smokeless tobacco: Never Used  Substance and Sexual Activity  . Alcohol use: No  . Drug use: No  . Sexual activity: Yes    Partners: Male    Birth control/protection: None  Other Topics Concern  . None  Social History Narrative   Lives with her parents.  She has 7 half-sisters and one half-brother.    Family History: Family History  Problem Relation Age of Onset  . Cancer Paternal Aunt   . Anemia Mother   . Diabetes Father   . Arthritis Father        gout  . Anemia Father   . Hypertension Father   . Heart disease Maternal  Grandfather     Allergies: No Known Allergies  Medications Prior to Admission  Medication Sig Dispense Refill Last Dose  . flintstones complete (FLINTSTONES) 60 MG chewable tablet Chew 1 tablet by mouth daily.   03/09/2017 at Unknown time   Review of Systems   All systems reviewed and negative except as stated in HPI  Blood pressure 125/72, pulse 91, temperature 98.3 F (36.8 C), temperature source Oral, resp. rate 16, height 5\' 2"  (1.575 m), weight 205 lb (93 kg), last menstrual period 05/26/2016. General appearance: alert, cooperative and no distress Lungs: clear to auscultation bilaterally Heart: regular rate and rhythm Abdomen: soft, non-tender; bowel sounds normal Pelvic: n/a Extremities: Homans sign is negative, no sign of DVT DTR's +2 Presentation: cephalic Fetal monitoringBaseline: 130 bpm, Variability: Good {> 6 bpm), Accelerations: Reactive and Decelerations: Absent Uterine activityFrequency: Every 4-8 minutes Dilation: 1 Effacement (%): 50 Station: -2 Exam by:: Summers   Prenatal labs: ABO, Rh: A/Positive/-- (06/01 1011) Antibody: Negative (06/01 1011) Rubella: 1.88 (06/01 1011) RPR: Non Reactive (08/29 1028)  HBsAg: Negative (06/01 1011)   HIV:    GBS:   Negative 2 hr Glucola 81/129/125 Genetic screening: negative Anatomy US: normal  Prenatal Transfer Tool  Maternal Diabetes: No Genetic Screening: Normal Maternal Ultrasounds/Referrals: Normal Fetal Ultrasounds or other Referrals:  None Maternal Substance Abuse:  No Significant Maternal Medications:  None Significant Maternal Lab Results: Lab values include: Group B Strep negative  Results for orders placed or performed during the hospital encounter of 03/09/17 (from the past 24 hour(s))  CBC   Collection Time: 03/09/17  8:38 AM  Result Value Ref Range   WBC 11.0 (H) 4.0 - 10.5 K/uL   RBC 4.25 3.87 - 5.11 MIL/uL   Hemoglobin 8.8 (L) 12.0 - 15.0 g/dL   HCT 16.129.3 (L) 09.636.0 - 04.546.0 %   MCV 68.9 (L) 78.0 - 100.0 fL   MCH 20.7 (L) 26.0 - 34.0 pg   MCHC 30.0 30.0 - 36.0 g/dL   RDW 40.918.3 (H) 81.111.5 - 91.415.5 %   Platelets 333 150 - 400 K/uL    Patient Active Problem List   Diagnosis Date Noted  . Encounter for induction of labor 03/09/2017  . Supervision of normal first teen pregnancy 12/10/2016  . Encounter for supervision of normal first pregnancy 09/12/2016  . Late prenatal care affecting pregnancy in second trimester 09/12/2016    Assessment/Plan:  Stephanie Simon is a 89 y.o. G1P0 at 6624w0d here for IOL for postdates  #Labor: Cytotec. Discussed with patient foley bulb placement, patient agreeable. Foley placed without difficulty and patient tolerated procedure well. Continue cytotec and plan pitocin when foley out #Pain: plans nitrous when needed #FWB: Cat 1 #ID: GBS neg #MOF: Breast #MOC: IUD #Circ:  girl  Rolm BookbinderCaroline M Neill, CNM  03/09/2017, 9:24 AM

## 2017-03-09 NOTE — Anesthesia Pain Management Evaluation Note (Signed)
  CRNA Pain Management Visit Note  Patient: Stephanie KirschnerMaiya Simon, 21 y.o., female  "Hello I am a member of the anesthesia team at [facility]. We have an anesthesia team available at all times to provide care throughout the hospital, including epidural management and anesthesia for C-section. I don't know your plan for the delivery whether it a natural birth, water birth, IV sedation, nitrous supplementation, doula or epidural, but we want to meet your pain goals."   1.Was your pain managed to your expectations on prior hospitalizations?   No prior hospitalizations  2.What is your expectation for pain management during this hospitalization?     Epidural  3.How can we help you reach that goal? unsure  Record the patient's initial score and the patient's pain goal.   Pain: 2  Pain Goal: 7 [facility] wants you to be able to say your pain was always managed very well.  Cephus ShellingBURGER,Julliana Whitmyer 03/09/2017

## 2017-03-09 NOTE — Progress Notes (Signed)
Labor Progress Note Stephanie KirschnerMaiya Simon is a 21 y.o. G1P0 at 6926w0d presented for IOL for postdates  S:  Patient ambulating, foley bulb still in place  O:  BP 135/72   Pulse 94   Temp (!) 97.5 F (36.4 C) (Oral)   Resp 18   Ht 5\' 2"  (1.575 m)   Wt 205 lb (93 kg)   LMP 05/26/2016   BMI 37.49 kg/m   Fetal Tracing:  Baseline: 135 Variability: moderate Accels: 15x15 Decels: none  Toco: 1-3   CVE: Dilation: 1 Effacement (%): Thick Cervical Position: Middle Station: -2 Presentation: Vertex Exam by:: Cleone Slimaroline Neill, CNM   A&P: 21 y.o. G1P0 5526w0d IOL for postdates #Labor: Progressing well. Continue expectant management #Pain: n/a #FWB: Cat 1 #GBS negative  Rolm Bookbinderaroline M Neill, CNM 1:02 PM

## 2017-03-09 NOTE — Progress Notes (Signed)
Labor Progress Note Stephanie Simon is a 21 y.o. G1P0 at 4663w0d presented for IOL for postdates  S:  Patient comfortable, no complaints  O:  BP 124/66   Pulse 89   Temp 99 F (37.2 C) (Oral)   Resp 18   Ht 5\' 2"  (1.575 m)   Wt 205 lb (93 kg)   LMP 05/26/2016   BMI 37.49 kg/m   Fetal Tracing:  Baseline: 140 Variability: moderate Accels: 15x15 Decels: none  Toco: 1-2   CVE: Dilation: 5 Effacement (%): 60 Cervical Position: Middle Station: -1 Presentation: Vertex Exam by:: Cleone Slimaroline Neill, CNM   A&P: 21 y.o. G1P0 9363w0d IOL for postdates #Labor: Progressing well. Continue pitocin #Pain: plans nitrous #FWB: Cat 1 #GBS negative  Rolm Bookbinderaroline M Neill, CNM 7:38 PM

## 2017-03-10 ENCOUNTER — Inpatient Hospital Stay (HOSPITAL_COMMUNITY): Payer: Medicaid Other | Admitting: Anesthesiology

## 2017-03-10 ENCOUNTER — Encounter (HOSPITAL_COMMUNITY): Payer: Self-pay

## 2017-03-10 DIAGNOSIS — Z3A41 41 weeks gestation of pregnancy: Secondary | ICD-10-CM

## 2017-03-10 DIAGNOSIS — O480 Post-term pregnancy: Secondary | ICD-10-CM

## 2017-03-10 LAB — RPR: RPR: NONREACTIVE

## 2017-03-10 MED ORDER — FENTANYL 2.5 MCG/ML BUPIVACAINE 1/10 % EPIDURAL INFUSION (WH - ANES)
INTRAMUSCULAR | Status: AC
  Filled 2017-03-10: qty 100

## 2017-03-10 MED ORDER — FENTANYL 2.5 MCG/ML BUPIVACAINE 1/10 % EPIDURAL INFUSION (WH - ANES)
14.0000 mL/h | INTRAMUSCULAR | Status: DC | PRN
  Administered 2017-03-10 (×4): 14 mL/h via EPIDURAL
  Filled 2017-03-10 (×2): qty 100

## 2017-03-10 MED ORDER — PHENYLEPHRINE 40 MCG/ML (10ML) SYRINGE FOR IV PUSH (FOR BLOOD PRESSURE SUPPORT)
80.0000 ug | PREFILLED_SYRINGE | INTRAVENOUS | Status: DC | PRN
  Filled 2017-03-10: qty 5

## 2017-03-10 MED ORDER — LACTATED RINGERS IV SOLN: 500.0000 mL | Freq: Once | INTRAVENOUS | Status: DC

## 2017-03-10 MED ORDER — LACTATED RINGERS IV SOLN
500.0000 mL | Freq: Once | INTRAVENOUS | Status: AC
  Administered 2017-03-10: 300 mL via INTRAVENOUS

## 2017-03-10 MED ORDER — EPHEDRINE 5 MG/ML INJ
10.0000 mg | INTRAVENOUS | Status: DC | PRN
  Filled 2017-03-10: qty 2

## 2017-03-10 MED ORDER — PHENYLEPHRINE 40 MCG/ML (10ML) SYRINGE FOR IV PUSH (FOR BLOOD PRESSURE SUPPORT)
PREFILLED_SYRINGE | INTRAVENOUS | Status: AC
  Filled 2017-03-10: qty 20

## 2017-03-10 MED ORDER — SODIUM CHLORIDE 0.9 % IV SOLN
2.0000 g | Freq: Four times a day (QID) | INTRAVENOUS | Status: DC
  Administered 2017-03-10 – 2017-03-11 (×4): 2 g via INTRAVENOUS
  Filled 2017-03-10 (×4): qty 2000

## 2017-03-10 MED ORDER — LACTATED RINGERS IV SOLN
INTRAVENOUS | Status: DC
  Administered 2017-03-10 (×2): via INTRAUTERINE
  Administered 2017-03-10: 300 mL via INTRAUTERINE

## 2017-03-10 MED ORDER — DIPHENHYDRAMINE HCL 50 MG/ML IJ SOLN: 12.5000 mg | INTRAMUSCULAR | Status: DC | PRN

## 2017-03-10 MED ORDER — FENTANYL CITRATE (PF) 100 MCG/2ML IJ SOLN
50.0000 ug | INTRAMUSCULAR | Status: DC | PRN
  Administered 2017-03-10 (×2): 100 ug via INTRAVENOUS
  Filled 2017-03-10 (×2): qty 2

## 2017-03-10 MED ORDER — DEXTROSE 5 % IV SOLN
5.0000 mg/kg | Freq: Once | INTRAVENOUS | Status: AC
  Administered 2017-03-10: 340 mg via INTRAVENOUS
  Filled 2017-03-10: qty 8.5

## 2017-03-10 MED ORDER — LIDOCAINE HCL (PF) 1 % IJ SOLN
INTRAMUSCULAR | Status: DC | PRN
  Administered 2017-03-10 (×2): 5 mL via EPIDURAL

## 2017-03-10 NOTE — Progress Notes (Signed)
Patient ID: Stephanie Simon, female   DOB: 1995/08/18, 21 y.o.   MRN: [id number]  On chart review, T of 100.9 seen at 0500 and was given Tylenol x 1. Now T 99.7. Other VSS, P 108.  FHR 130s, +LTV, though has decreased variability at times, +10x10accels Ctx q 3 mins, but very dampened waveform- Pit @ 7716mu/min Cx 8/90/0 @ 1330  IUP@term  Protracted active phase Triple I  Will start Amp/Gent Continue to increase Pit Check cx within next hour  Cam HaiSHAW, Aydee Mcnew CNM 03/10/2017 3:31 PM

## 2017-03-10 NOTE — Anesthesia Preprocedure Evaluation (Signed)

## 2017-03-10 NOTE — Progress Notes (Signed)
LABOR PROGRESS NOTE  Stephanie Simon is a 21 y.o. G1P0 at 7574w0d  admitted for IOL for postdates  Subjective: No acute complaints. Comfortable with epidural.  Called to room due to frank bleeding and blood clots. Patient comfortable. Rn turned of pitocin.   Objective: BP (!) 152/71   Pulse (!) 109   Temp 100 F (37.8 C)   Resp 20   Ht 5\' 2"  (1.575 m)   Wt 93 kg (205 lb)   LMP 05/26/2016   SpO2 99%   BMI 37.49 kg/m  or  Vitals:   03/10/17 0301 03/10/17 0329 03/10/17 0331 03/10/17 0401  BP: 131/71 127/64 116/61 (!) 152/71  Pulse: 95 (!) 109 (!) 116 (!) 109  Resp:    20  Temp:    100 F (37.8 C)  TempSrc:      SpO2:      Weight:      Height:        Dilation: 6.5 Effacement (%): 90 Cervical Position: Middle Station: -1 Presentation: Vertex Exam by:: Stephanie Simon  FHT: HR 160, Moderate variability, + accels, no decels Toco: q1-3 min  AROM 2300 - minimal clear fluid  Labs: Lab Results  Component Value Date   WBC 11.0 (H) 03/09/2017   HGB 8.8 (L) 03/09/2017   HCT 29.3 (L) 03/09/2017   MCV 68.9 (L) 03/09/2017   PLT 333 03/09/2017    Patient Active Problem List   Diagnosis Date Noted  . Encounter for induction of labor 03/09/2017  . Supervision of normal first teen pregnancy 12/10/2016  . Encounter for supervision of normal first pregnancy 09/12/2016  . Late prenatal care affecting pregnancy in second trimester 09/12/2016    Assessment / Plan: 21 y.o. G1P0 at 7274w0d here for IOL for postdates  Labor: S/p FB, cytotec for induction. Pitocin currently discontinued due to bleeding. Unsure of source of bleeding. Fetus tolerating bleeding. Restart pitocin after 30 min at 408mu/min. This will allow for some uterine rest. IUPC in place with goal to have adequate MVU. Fetal Wellbeing: Cat 1 Pain Control:  Epidural Anticipated MOD: SVD   Caryl AdaJazma Elidia Bonenfant, DO 03/10/2017, 4:40 AM

## 2017-03-10 NOTE — Progress Notes (Signed)
Stephanie KirschnerMaiya Simon is a 21 y.o. G1P0 at 3173w1d.  Subjective: Patient's pain is well controlled, still feels pressure but no impulse to push yet  Objective: BP (!) 118/56   Pulse 86   Temp 98.3 F (36.8 C) (Oral)   Resp 16   Ht 5\' 2"  (1.575 m)   Wt 205 lb (93 kg)   LMP 05/26/2016   SpO2 99%   BMI 37.49 kg/m    FHT:  FHR: 125 bpm, variability: appropriate,  accelerations:  15x15,  decelerations:  none UC:   Q 2-3 minutes Dilation: 8 Effacement (%): 90 Cervical Position: Middle Station: 0 Presentation: Vertex Exam by:: Philipp DeputyKim Shaw, CNM  Labs: No results found for this or any previous visit (from the past 24 hour(s)).  Assessment / Plan: 3373w1d week IUP Labor: expectant managmeent Fetal Wellbeing:  Category 1 Pain Control:  epidural Anticipated MOD:  SVD  Marthenia RollingBland, Tyner Codner, DO 03/10/2017 1:24 PM

## 2017-03-10 NOTE — Progress Notes (Signed)
Patient ID: [name], female   DOB: 01/11/1996, 21 y.o.   MRN: [id number]  Pitocin stopped approx an hour ago for late onset variables; pt hurting in upper back- being on her side helps  BP 126/51, P 125, T 98.5 FHR 140s, now +LTV, +10x10accels Ctx spaced out Cx 8/90/0  IUP@term  Protracted active phase secondary to decreased ctx  Amnioinfusion started; will also restart Pit @ 176mu/min Anticipate SVD  Cam HaiSHAW, Saralyn Willison CNM 03/10/2017 11:24 AM

## 2017-03-10 NOTE — Anesthesia Procedure Notes (Addendum)
Epidural Patient location during procedure: OB Start time: 03/10/2017 2:10 AM End time: 03/10/2017 2:19 AM  Staffing Anesthesiologist: Bethena Midgetddono, Atianna Haidar, MD  Preanesthetic Checklist Completed: patient identified, site marked, surgical consent, pre-op evaluation, timeout performed, IV checked, risks and benefits discussed and monitors and equipment checked  Epidural Patient position: sitting Prep: site prepped and draped and DuraPrep Patient monitoring: continuous pulse ox and blood pressure Approach: midline Location: L3-L4 Injection technique: LOR air  Needle:  Needle type: Tuohy  Needle gauge: 17 G Needle length: 9 cm and 9 Needle insertion depth: 6 cm Catheter type: closed end flexible Catheter size: 19 Gauge Catheter at skin depth: 11 cm Test dose: negative  Assessment Events: blood not aspirated, injection not painful, no injection resistance, negative IV test and no paresthesia

## 2017-03-10 NOTE — Progress Notes (Signed)
Labor Progress Note Elmon KirschnerMaiya Hammond is a 21 y.o. G1P0 at 1463w1d presented for IOL for postdates S: No complaints  O:  BP 114/61   Pulse (!) 105   Temp 99.4 F (37.4 C) (Oral)   Resp 18   Ht 5\' 2"  (1.575 m)   Wt 205 lb (93 kg)   LMP 05/26/2016   SpO2 99%   BMI 37.49 kg/m  EFM: 150 bpm/pos acels/no decels  CVE: Dilation: 8 Effacement (%): 90 Cervical Position: Middle Station: +1 Presentation: Vertex Exam by:: Dr. Rachelle HoraMoss   A&P: 21 y.o. G1P0 7363w1d here for IOL for postdates #Labor: No cervical change. Continue to increase pitocin.  Rolm BookbinderAmber Blakelyn Dinges, DO 4:46 PM

## 2017-03-11 ENCOUNTER — Telehealth: Payer: Self-pay | Admitting: Radiology

## 2017-03-11 MED ORDER — PRENATAL MULTIVITAMIN CH
1.0000 | ORAL_TABLET | Freq: Every day | ORAL | Status: DC
  Filled 2017-03-11 (×2): qty 1

## 2017-03-11 MED ORDER — COCONUT OIL OIL: 1.0000 "application " | TOPICAL_OIL | Status: DC | PRN

## 2017-03-11 MED ORDER — BENZOCAINE-MENTHOL 20-0.5 % EX AERO
1.0000 "application " | INHALATION_SPRAY | CUTANEOUS | Status: DC | PRN
  Administered 2017-03-11: 1 via TOPICAL
  Filled 2017-03-11: qty 56

## 2017-03-11 MED ORDER — TETANUS-DIPHTH-ACELL PERTUSSIS 5-2.5-18.5 LF-MCG/0.5 IM SUSP: 0.5000 mL | Freq: Once | INTRAMUSCULAR | Status: DC

## 2017-03-11 MED ORDER — ACETAMINOPHEN 325 MG PO TABS: 650.0000 mg | ORAL_TABLET | ORAL | Status: DC | PRN

## 2017-03-11 MED ORDER — SENNOSIDES-DOCUSATE SODIUM 8.6-50 MG PO TABS: 2.0000 | ORAL_TABLET | ORAL | Status: DC

## 2017-03-11 MED ORDER — DIPHENHYDRAMINE HCL 25 MG PO CAPS: 25.0000 mg | ORAL_CAPSULE | Freq: Four times a day (QID) | ORAL | Status: DC | PRN

## 2017-03-11 MED ORDER — OXYCODONE-ACETAMINOPHEN 5-325 MG PO TABS: 1.0000 | ORAL_TABLET | ORAL | Status: DC | PRN

## 2017-03-11 MED ORDER — ONDANSETRON HCL 4 MG PO TABS: 4.0000 mg | ORAL_TABLET | ORAL | Status: DC | PRN

## 2017-03-11 MED ORDER — SIMETHICONE 80 MG PO CHEW: 80.0000 mg | CHEWABLE_TABLET | ORAL | Status: DC | PRN

## 2017-03-11 MED ORDER — DIBUCAINE 1 % RE OINT: 1.0000 "application " | TOPICAL_OINTMENT | RECTAL | Status: DC | PRN

## 2017-03-11 MED ORDER — IBUPROFEN 600 MG PO TABS
600.0000 mg | ORAL_TABLET | Freq: Four times a day (QID) | ORAL | Status: DC
  Administered 2017-03-11 (×3): 600 mg via ORAL
  Filled 2017-03-11 (×6): qty 1

## 2017-03-11 MED ORDER — ONDANSETRON HCL 4 MG/2ML IJ SOLN: 4.0000 mg | INTRAMUSCULAR | Status: DC | PRN

## 2017-03-11 MED ORDER — WITCH HAZEL-GLYCERIN EX PADS: 1.0000 "application " | MEDICATED_PAD | CUTANEOUS | Status: DC | PRN

## 2017-03-11 MED ORDER — ZOLPIDEM TARTRATE 5 MG PO TABS: 5.0000 mg | ORAL_TABLET | Freq: Every evening | ORAL | Status: DC | PRN

## 2017-03-11 NOTE — Anesthesia Postprocedure Evaluation (Signed)
Anesthesia Post Note  Patient: Stephanie KirschnerMaiya Simon  Procedure(s) Performed: AN AD HOC LABOR EPIDURAL     Patient location during evaluation: Mother Baby Anesthesia Type: Epidural Level of consciousness: awake Pain management: pain level controlled Respiratory status: spontaneous breathing Cardiovascular status: stable Postop Assessment: no headache, no backache, epidural receding, patient able to bend at knees, no apparent nausea or vomiting and adequate PO intake Anesthetic complications: no    Last Vitals:  Vitals:   03/11/17 0200 03/11/17 0600  BP: 130/63 129/83  Pulse: (!) 106 99  Resp: 16 17  Temp: 37.4 C 37.1 C  SpO2: 98% 98%    Last Pain:  Vitals:   03/11/17 0600  TempSrc: Oral  PainSc: 0-No pain   Pain Goal: Patients Stated Pain Goal: 8 (03/09/17 1301)               Rolla Kedzierski

## 2017-03-11 NOTE — Progress Notes (Signed)
POSTPARTUM PROGRESS NOTE  Post Partum Day 1  Subjective:  Stephanie Simon is a 21 y.o. G1P1001 s/p SVD at 278w1d.  No acute events overnight.  Pt denies problems with ambulating, or po intake. Has not been able to void on her own yet  She denies nausea or vomiting.  Pain is well controlled.   Objective: Blood pressure (!) 115/56, pulse 87, temperature 98 F (36.7 C), temperature source Oral, resp. rate 18, height 5\' 2"  (1.575 m), weight 205 lb (93 kg), last menstrual period 05/26/2016, SpO2 98 %, unknown if currently breastfeeding.  Physical Exam:  General: alert, cooperative and no distress Chest: no respiratory distress Heart:regular rate, distal pulses intact Abdomen: soft, nontender,  Uterine Fundus: firm, appropriately tender DVT Evaluation: No calf swelling or tenderness Extremities: no edema Skin: warm, dry  Recent Labs    03/09/17 0838  HGB 8.8*  HCT 29.3*    Assessment/Plan: Stephanie Simon is a 21 y.o. G1P1001 s/p SVD at 6478w1d   PPD#1 - Doing well Contraception: IUD Feeding: breast Dispo: Plan for discharge tomorrow.   LOS: 2 days   Kandra NicolasJulie P DegeleMD 03/11/2017

## 2017-03-11 NOTE — Lactation Note (Signed)
This note was copied from a baby's chart. Lactation Consultation Note  Patient Name: Stephanie Elmon KirschnerMaiya Flagg UJWJX'BToday's Date: 03/11/2017 Reason for consult: Follow-up assessment(called to check latch , started at 1550 )  2nd LC visit  As LC entered the room , grandmother had assisted mom to latch baby on the left breast  Modified laid back, swallows noted. Baby fed 15 mins.    Maternal Data Has patient been taught Hand Expression?: Yes Does the patient have breastfeeding experience prior to this delivery?: No  Feeding Feeding Type: (latched left breast ) Length of feed: (multiple swallows )  LATCH Score Latch: (latched with depth )  Audible Swallowing: (several swallows )     Comfort (Breast/Nipple): (mom comfortable )  Hold (Positioning): (grandmother assisted )     Interventions Interventions: Breast feeding basics reviewed  Lactation Tools Discussed/Used WIC Program: Yes([facility]   Consult Status Consult Status: Follow-up Date: 03/12/17 Follow-up type: In-patient    Matilde SprangMargaret Ann Ashritha Desrosiers 03/11/2017, 4:03 PM

## 2017-03-11 NOTE — Progress Notes (Signed)
Bladder scan complete, 307 ml. Will straight cath.

## 2017-03-11 NOTE — Lactation Note (Signed)
This note was copied from a baby's chart. Lactation Consultation Note  Patient Name: Girl Elmon KirschnerMaiya Portlock WUJWJ'XToday's Date: 03/11/2017 Reason for consult: Initial assessment;Other (Comment)(last fed at 1100, 3 family members visiting, since it has been since 1100 baby has fed , encouraged checking diaper, checking and changing diaper if needed/ STS and call with feeding cues. ) Baby 16 hours old, PROM - 23 hours , Apgar - 07-19-08, terminal mec at birth.  LC reviewed doc flow sheets and updated per mom  LC reviewed basics , LATCH scoring, and the importance of calling for feeding assessment.  Discussed supply and demand, and normal feeding behaviors for term infants.  M[facility] RN Cain SaupeMartha Stringer aware of the consult out come.  LC enc mom to call with feeding cues.  The uncle holding baby and she is sound asleep, grandmother and aunt also present.  Mom, and grandmother asked several appropriate breast  Feeding questions.  Per mom has a hand pump at home and grandmother plans to bring it in the am .    Maternal Data Does the patient have breastfeeding experience prior to this delivery?: No  Feeding ( feeding from 11 am ) per mom noted swallows  Feeding Type: (encouraged to feed skin to skin) Length of feed: 20 min  LATCH Score                   Interventions Interventions: Breast feeding basics reviewed  Lactation Tools Discussed/Used WIC Program: Yes([facility] )   Consult Status Consult Status: Follow-up Date: 03/12/17 Follow-up type: In-patient    Matilde SprangMargaret Ann Mory Herrman 03/11/2017, 2:58 PM

## 2017-03-11 NOTE — Progress Notes (Signed)
Patient stated that she has not been drinking a lot of fluid. Patient encouraged to increase fluids. Patient verbalized understanding.

## 2017-03-11 NOTE — Progress Notes (Signed)
Pt up to bathroom, still unable to void. Patient stated that she does not have the urge to void. Will use bladder scan.

## 2017-03-11 NOTE — Progress Notes (Signed)
CSW acknowledged consult and completed chart review.  CSW screened out consult due to MOB receiving PNC at 15.4 weeks with [facility].    CSW informed CN of OB record regarding PNC visits.   Khrystyna Schwalm Boyd-Gilyard, MSW, LCSW Clinical Social Work [phone] 

## 2017-03-11 NOTE — Progress Notes (Signed)
Pt up to bathroom, unable to void. Sprite/cranberry/sugar drink given. Will reassess.

## 2017-03-11 NOTE — Telephone Encounter (Signed)
Left message on the cell phone voicemail for postpartum appointment scheduled 04/15/17 @ 2:00, patietn to call [facility] if need to reschedule

## 2017-03-12 LAB — URINALYSIS, ROUTINE W REFLEX MICROSCOPIC
BILIRUBIN URINE: NEGATIVE
Bacteria, UA: NONE SEEN
GLUCOSE, UA: NEGATIVE mg/dL
KETONES UR: NEGATIVE mg/dL
Nitrite: NEGATIVE
PH: 6 (ref 5.0–8.0)
PROTEIN: 100 mg/dL — AB
Specific Gravity, Urine: 1.011 (ref 1.005–1.030)
Squamous Epithelial / LPF: NONE SEEN

## 2017-03-12 MED ORDER — FERROUS SULFATE 325 (65 FE) MG PO TABS: 325.0000 mg | ORAL_TABLET | Freq: Every day | ORAL | 0 refills | Status: DC

## 2017-03-12 NOTE — Plan of Care (Signed)
POC discussed with pt, no questions at this time.   

## 2017-03-12 NOTE — Lactation Note (Signed)
This note was copied from a baby's chart. Lactation Consultation Note  Patient Name: Stephanie [name] WUJWJ'XToday's Date: 03/12/2017 Reason for consult: Follow-up assessment  Baby is 4638 hours old,  LC reviewed and updated doc flow sheets per mom  Baby awake and hungry, grandma changed the baby's stool diaper  LC assisted mom on right and left breast with football position.  1st breast 15 mins with swallows, and right 1st tried cross cradle  And baby only kept the depth 5 mins. LC helped to switch to a football  Position, and depth achieved/ multiple swallows. Baby fed for 10 mins  And comfort  Achieved on both breast. Per mom nipples are sensitive -  Right clear , left tiny scab.  LC suspects mom hasn't always achieved the depth with latch.  LC stressed the importance of obtaining the depth when latching.  Due size of the baby and the number of stools , LC explained to mom and grandmother  Pecola LeisureBaby will be getting hunger so offer both breast very feeding.  DEBP set up for post pumping to enhance the volume increasing.  LC instructed mom on the use shells and comfort gels. Instructed mom to use comfort gels after  Feedings , and alternate with shells.  Breast are fuller today, and easily express milk before latch. Stressed the importance of breast compressions  With latch and intermittently.  Mom, grandmother, and aunt receptive to teaching. ( mom fine with these family members present for breast feeding)  Grandpa stepped out.  LC gave report to Sears Holdings Corporationbbie Polos , MBURN.    Maternal Data    Feeding Feeding Type: Breast Fed Length of feed: (swallows noted )  LATCH Score Latch: Grasps breast easily, tongue down, lips flanged, rhythmical sucking.  Audible Swallowing: Spontaneous and intermittent  Type of Nipple: Everted at rest and after stimulation  Comfort (Breast/Nipple): Filling, red/small blisters or bruises, mild/mod discomfort  Hold (Positioning): Assistance needed to correctly  position infant at breast and maintain latch.  LATCH Score: 8  Interventions Interventions: Breast feeding basics reviewed;Assisted with latch;Skin to skin;Breast massage;Hand express;Breast compression;Adjust position;Support pillows;Position options;Expressed milk  Lactation Tools Discussed/Used Tools: Shells;Pump;Comfort gels Shell Type: Inverted Breast pump type: Double-Electric Breast Pump   Consult Status Consult Status: Follow-up Date: 03/13/17 Follow-up type: In-patient    Stephanie SprangMargaret Ann Gearldine Simon 03/12/2017, 12:36 PM

## 2017-03-13 ENCOUNTER — Ambulatory Visit: Payer: Self-pay

## 2017-03-13 NOTE — Lactation Note (Signed)
This note was copied from a baby's chart. Lactation Consultation Note  Patient Name: Stephanie Simon  Pecola LeisureBaby is 58 hours old and 7% weight loss.  Output WNL.  Mom states baby is feeding well and no concerns or questions.  Discharge teaching done including engorgement treatment.  Lactation outpatient services and support reviewed and encouraged prn.   Maternal Data    Feeding    LATCH Score                   Interventions    Lactation Tools Discussed/Used     Consult Status      Huston FoleyMOULDEN, [name], 9:04 AM

## 2017-03-14 ENCOUNTER — Ambulatory Visit: Payer: Self-pay

## 2017-03-14 NOTE — Lactation Note (Signed)
This note was copied from a baby's chart. Lactation Consultation Note  Patient Name: Stephanie Simon MCNOB'S Date: 03/14/2017 Reason for consult: Follow-up assessment;NICU baby Infant is 44 hours old & mom seen in NICU pumping room for follow-up. NICU RN called saying mom needed symphony membrane caps to be able to use the pump in the NICU. LC brought mom a new kit because we do not have any spare parts. Mom does not have a DEBP at home and so has been very full & sore lately. Mom does have a hand pump so had been doing this before LC came & mom had pumped several ounces. Reviewed setting up Symphony pump and fitted mom with size 25m flanges. Milk flowed right away. Discussed importance of pumping at least q 3hrs for 15-20 mins followed by hand expression. Encouraged mom &/or visitor to massage while pumping as able as well. [place] has [place] in the hospital prior to baby's NICU admission so mom did not go home with a pump. Showed mom how to use kit to manually pump both breasts and [facility] for [facility] to call so she can hopefully get a DEBP on Monday. Encouraged mom to use pump in the NICU q 3hrs when she is visiting as well. Provided mom with extra bottles to be able to store & provide milk for the NICU. Mom reports no questions. Encouraged mom to ask for help as needed.  Maternal Data    Feeding Feeding Type: Breast Milk Nipple Type: Slow - flow Length of feed: 15 min  LATCH Score                   Interventions Interventions: Breast massage;Hand express;DEBP  Lactation Tools Discussed/Used WIC Program: Yes   Consult Status Consult Status: Complete    LYvonna Alanis12/04/2016, 11:53 AM

## 2017-04-01 NOTE — Discharge Summary (Signed)
OB Discharge Summary                           Patient Name: Stephanie KirschnerMaiya Simon DOB: 01-Jul-1995 MRN: [id number]  Date of admission: 03/09/2017 Delivering MD: Frederik PearEGELE, JULIE P   Date of discharge: 03/12/2017  Admitting diagnosis: INDUCTION Intrauterine pregnancy: 6268w1d     Secondary diagnosis:  Active Problems:   Encounter for induction of labor  Additional problems: None                                      Discharge diagnosis: Post-term pregnancy delivered                                                                                                Post partum procedures:none  Augmentation: AROM, Pitocin, Cytotec and Foley Balloon  Complications: Developed intraamniotic infection (received ampicillin and gentamycin), 60-second shoulder dystocia    Hospital course:  Induction of Labor With Vaginal Delivery   21 y.o. yo G1P1001 at 4668w1d was admitted to the hospital 03/09/2017 for induction of labor.  Indication for induction: Postdates.  Patient had an uncomplicated labor course as follows: Membrane Rupture Time/Date: 11:03 PM ,03/09/2017   Intrapartum Procedures: Episiotomy: None [1]                                         Lacerations:  Sulcus [9];1st degree [2];Perineal [11]  Patient had delivery of a Viable infant.  Information for the patient's newborn:  Stanford ScotlandDavis, Girl Selene [[id number]][[id number]]  Delivery Method: Vaginal, Spontaneous(Filed from Delivery Summary)   03/10/2017  Details of delivery can be found in separate delivery note.  Patient had a routine postpartum course. Patient is discharged home 03/12/17.  Physical exam        Vitals:   03/11/17 0600 03/11/17 1430 03/11/17 1842 03/12/17 0550  BP: 129/83 126/75 (!) 115/56 123/68  Pulse: 99 87 87 87  Resp: 17 18 18 17   Temp: 98.7 F (37.1 C) 97.8 F (36.6 C) 98 F (36.7 C) 97.9 F (36.6 C)  TempSrc: Oral Oral Oral Oral  SpO2: 98% 98%    Weight:      Height:       General: alert,  cooperative and no distress Lochia: Patient's lochia is decreased from yesterday. Patient describes as approximately 1.5 times the flow of her typical menstrual period Uterine Fundus: firm Incision: N/A DVT Evaluation: No signs concerning for DVT on physical exam Labs: RecentLabs       Lab Results  Component Value Date   WBC 11.0 (H) 03/09/2017   HGB 8.8 (L) 03/09/2017   HCT 29.3 (L) 03/09/2017   MCV 68.9 (L) 03/09/2017   PLT 333 03/09/2017     No flowsheet data found.  Discharge instruction: per After Visit Summary and "Baby and Me Booklet".  Diet: routine diet  Activity: Advance as tolerated. Pelvic rest for 6 weeks.  Outpatient follow up:04/15/2017 Follow up Appt:       Future Appointments  Date Time Provider Department Center  04/15/2017  2:00 PM Marylene LandKooistra, Kathryn Lorraine, CNM [facility]   Follow up Visit:No Follow-up on file.  Postpartum contraception: IUD  Newborn Data: Live born female  Birth Weight: 9 lb 3.8 oz (4190 g) APGAR: 4, 7  Newborn Delivery   Birth date/time:  03/10/2017 22:09:00 Delivery type:  Vaginal, Spontaneous     Baby Feeding: Breast Disposition:home with mother   Rolm BookbinderAmber Addylynn Balin, DO

## 2017-04-15 ENCOUNTER — Ambulatory Visit (INDEPENDENT_AMBULATORY_CARE_PROVIDER_SITE_OTHER): Payer: Medicaid Other | Admitting: Student

## 2017-04-15 VITALS — BP 104/83 | HR 100 | Wt 171.1 lb

## 2017-04-15 DIAGNOSIS — Z1389 Encounter for screening for other disorder: Secondary | ICD-10-CM | POA: Diagnosis not present

## 2017-04-15 DIAGNOSIS — Z3403 Encounter for supervision of normal first pregnancy, third trimester: Secondary | ICD-10-CM

## 2017-04-15 DIAGNOSIS — R52 Pain, unspecified: Secondary | ICD-10-CM

## 2017-04-15 NOTE — Progress Notes (Signed)
Subjective:     Elmon KirschnerMaiya Swinford is a 22 y.o. female who presents for a postpartum visit. She is five weeks postpartum following a vaginal delivery on 03/10/2017. I have fully reviewed the prenatal and intrapartum course. The delivery was at 41 gestational weeks. Outcome: vaginal. Anesthesia: Epidual. Postpartum course has been uncomplicated. Baby's course has been uncomplicated. Baby is feeding by breast. Bleeding yes. Bowel function is some constipation. Bladder function is normal. Patient is not sexually active. Contraception method is nothing at this time. Postpartum depression screening: negative   Review of Systems Genitourinary:negative, patient states that she is still having some vaginal pain when she stands for long periods of time, and also some burning when she urinates. She denies abnormal discharge, dysuria, lesions or odor.   She also has some breast pain; states that her baby had thrush last week and is on oral nystatin. She is exclusively pumping but did try to latch the baby one time a few days ago and since then has noticed some breast pain after pumping.  Objective:    LMP 05/26/2016   General:  alert, cooperative and no distress   Breasts:  inspection negative, no nipple discharge or bleeding, no masses or nodularity palpable No flaking or signs of thrush. No redness, streaks, hot spots.   Lungs: clear to auscultation bilaterally  Heart:  regular rate and rhythm, S1, S2 normal, no murmur, click, rub or gallop  Abdomen: soft, non-tender; bowel sounds normal; no masses,  no organomegaly   Vulva:  normal  Vagina: lacerations appear well-healed, although still slightly tender and edematous. No signs of infection.   Cervix:  not evaluated  Corpus: not examined  Adnexa:  not evaluated  Rectal Exam: Not performed.        Assessment:    Healthy  postpartum exam. Pap smear not done at today's visit.   Plan:    1. Contraception: plans IUD once her vaginal pain has disappeared.  2.  Assured patient that vagina looks healthy and normal but that she should come back in one month if she is still having vaginal pain.  3. Recommended topical lotrimin if she feels she has thrush; instructions given. Recommended follow up with LLL or Systems analystlactaction consultant.  3. Follow up in: 1 month or as needed.

## 2017-04-15 NOTE — Patient Instructions (Signed)
-Return in 1 month of vaginal pain is not resolved -Buy lotrimin at the drug store and apply after pumping.   IS THRUSH CAUSING MY SORE NIPPLES? Persistent nipple pain in the early weeks of breastfeeding, or nipple pain that appears after several weeks or months of pain-free nursing, may be caused by thrush, which is a yeast infection of the nipples. Ginette Pitman is caused by a yeast fungus, usually Candida albicans. Additional symptoms can include: Itchy or burning nipples that appear fiery red, shiny, flaky, and/or have a rash with tiny blisters  Cracked nipples  Shooting pains in the breast during or after feedings  Intense nipple or breast pain that is not improved with better latch-on and positioning You may be at higher risk for developing thrush if you or your baby has had a recent course of antibiotics, your nipples are cracked or damaged, or you are taking oral contraceptives or steroids (such as for asthma). Be sure to examine other causes of nipple and breast pain. Positioning and latching problems are the most common causes of pain. Breastfeeding isn't supposed to hurt! Check out our post on Sore Nipples for more information. TREATMENT FOR THRUSH Thrush can be very difficult to treat. It is essential for both you and your baby to be treated for thrush, because it is easily spread, and thrives in warm moist environments, such as your baby's mouth and your nipples. A baby may also have yeast rashes in the diaper area. Any skin that touches other skin is especially vulnerable for the breastfeeding dyad: under arms or breasts, between fingers or toes, in the groin area, and even in the creases of the eyelid. Yeast can spread to other family members as well, especially with shared bedding or eating utensils or cups. According to the Academy of Breastfeeding Medicine, nystatin should be the first choice for treating thrush[i]. ABM recommends: Topical azole antifungal ointment or cream (miconazole and  clotrimazole also inhibit the growth of Staphylococcus (bacteria) on nipples.[ii]  Nystatin suspension or miconazole oral gel for infant's mouth.[ii]  Gentian violet (less than 0.5% aqueous solution) may be used daily for no more than 7 days. Longer durations and higher concentrations may cause ulcerations and skin necrosis.. Of course, different people react better to different medicines, so you may have to try more than one. Check with your health care professional about the medications listed and other options. After treatment for thrush begins, the symptoms may not disappear quickly. If the pain continues, offer your baby short, frequent feedings, beginning on the least painful breast. Some mothers use crushed ice to reduce pain before starting to nurse. Taking mild over-the-counter pain medication (whatever you find effective for a headache) can also be useful. Rinse your nipples with clean water and let them air dry after each feeding. Wash your hands with soap and water very frequently during the treatment period - especially after nursing, diaper changes, and handling your breasts. Dry with a clean towel, or even paper towels during the treatment period. Pumping your breasts and offering baby milk by a small cup may be another alternative, especially while you have cracked nipples. Just make sure you are sterilizing pump parts and feeding utensils after each use. (The milk you pump during a thrush outbreak can be fed to your baby but should not be frozen for future usage. Freezing breastmilk may not kill the yeast in it.) Yeast infections take some time to treat and heal. There is a section on treatment for other causes of  sore nipples in this post. Anything that goes into your baby's mouth (other than your breast or a finger) should be boiled for 20 minutes a day and replaced every week. Toys that go in your baby's mouth should be washed with hot, soapy water frequently In addition to the medical  treatment, there are other steps you can take. Wash all bras, bra pads, nightgowns, etc. (anything that comes in contact with your nipples) in HOT water with bleach and dry on hot in the dryer or in the sun.  If you are using cloth diapers, wash with HOT water and bleach or a similar alternative. Consider using disposable diapers until the yeast infection is gone.  Rinsing your nipples with a vinegar and water solution (1 tablespoon apple cider vinegar preferred to 1 cup water) or baking soda in water (1 tablespoon per cup) after every feeding is helpful. Use a fresh cotton ball for each application and mix a new solution every day. Wash your hands thoroughly.  Many women find that reducing sugar, yeast, and dairy products in their diet helps.  Consider switching to a non-antibacterial handsoap during this time. Antibacterial soaps kill both good and bad bacteria, and good bacteria keeps yeast in check. These home remedies can be effective. They should be in addition to the medication, not instead of it. PERSISTENT THRUSH Maybe you and your family have been dealing with thrush for weeks or months. A persistent thrush infection can be exhausting, and you are to be commended for sticking it out so long through this challenge! Remember, it is important to treat the breasts and the baby's mouth at the same time consistently. If you find that all of these treatments for your yeast infection do not improve your situation, it is possible that you have a bacterial infection and not yeast, or both. Bacterial infections may occur with or after treatment of yeast symptoms. Talk to your healthcare provider about this possibility. Pacifier use is a common in breastfeeding dyads who develop yeast infections. Family member are also susceptible; men particularly do not exhibit outward symptoms. Ginette Pitmanhrush can be passed back and forth between parents during sexual relations. Yeast infections can go from child to child if  shared toys are put in their mouths or in a tandem nursing situation. If thrush continues to recur after you and your baby have had two full courses of treatment, all members of the family may need to be treated simultaneously. POINTS TO CONSIDER WITH RECURRENT YEAST INFECTIONs 1. Avoid sugar, including fruit and artificial sweeteners. 2. Avoid anything with yeast, including breads. Cut back on high carbohydrate foods, which are full of yeast. 3. Avoid dairy products, except yogurt with live cultures. 4. Ask your partner to be checked for a yeast infection any place where yeast can live. 5. Check everyone in the family for cracks in the corner of the mouth or fingers 6. If you have a dishwasher, set the temperature hot enough to kill yeast on glasses, dishes and utensils for oral yeast in family members using these dishes. If you handwash, dip the dishes and utensils in a bleach solution first. 7. Eliminate the use of Natural B vitamins, such as Brewer's Yeast, for a time. 8. Brush your tongue as well as your teeth. 9. Replace toothbrushes regularly. Boil or soak in a 10% bleach solution after each bout of thrush, or replace. 10. Disinfect dental or orthodontic appliances each time they are removed from the mouth. 11. Discard roll-on or solid deodorant  after the initial yeast outbreak has cleared. 12. Check for yeast growing in or under/around finger or toenails, under arms or breasts, in the groin or baby's diaper area. Does baby suck thumb, finger or knuckles? Check them carefully. Wash baby's hands frequently. Also check the finger and toenail beds and where skin touches skin for the entire family. 13. Take precautions to avoid the spread of yeast with family underwear, bras, and towels. 14. Wear underwear and other undergarments with a cotton crotch. Avoid synthetic underwear and tight jeans or tight leggings. 15. Change quickly out of sweaty exercise clothes or wet swimsuits. 16. Wash your  hands every time you use the toilet, handle your breasts or milk, put your fingers in your own or your baby's mouth, or change diapers (nappies). 17. Treat everything that you put in your mouth or your children put in theirs to kill yeast. 18. Disinfect inhalers or breathing treatment machines for asthma or other conditions between uses. 19. Replace makeup after a yeast infestation. Yeast can live on lipsticks, lip and eye liners, eye shadows, mascaras, foundations and powders. Disinfect or replace makeup applicators. 20. Have a veterinarian check animals for yeast. Pets with fur can harbor yeast, particularly in their ears. Feathered pets can have yeast overgrowths, too. Many women find that reducing yeast, sugar, and dairy products in their diet helps. In particularly severe cases, it may be necessary to eliminate most or all refined sugars and starches. One rule of thumb that some moms have used while treating stubborn thrush is to eat a "MEVY" diet: most of the diet should be meat, eggs, vegetables, and plain yogurt. If the infection continues to resist treatment, you might also want to have medical tests done to rule out other conditions including anemia and diabetes. Attend a [employer] meeting in your area for additional information and support. RESOURCES FOR ADDITIONAL INFORMATION

## 2017-04-16 DIAGNOSIS — R52 Pain, unspecified: Secondary | ICD-10-CM

## 2017-05-19 ENCOUNTER — Ambulatory Visit: Payer: Medicaid Other | Admitting: Obstetrics and Gynecology

## 2017-05-28 ENCOUNTER — Ambulatory Visit: Payer: Medicaid Other | Admitting: Obstetrics and Gynecology

## 2017-06-23 ENCOUNTER — Encounter: Payer: Self-pay | Admitting: Obstetrics and Gynecology

## 2017-06-23 ENCOUNTER — Ambulatory Visit (INDEPENDENT_AMBULATORY_CARE_PROVIDER_SITE_OTHER): Payer: Medicaid Other | Admitting: Obstetrics and Gynecology

## 2017-06-23 VITALS — BP 120/72 | HR 98 | Wt 172.0 lb

## 2017-06-23 DIAGNOSIS — Z30011 Encounter for initial prescription of contraceptive pills: Secondary | ICD-10-CM

## 2017-06-23 MED ORDER — LO LOESTRIN FE 1 MG-10 MCG / 10 MCG PO TABS: 1.0000 | ORAL_TABLET | Freq: Every day | ORAL | 3 refills | Status: DC

## 2017-06-23 NOTE — Progress Notes (Signed)
Obstetrics and Gynecology Established Patient Evaluation  Appointment Date: 06/24/2017  [facility]  Primary Care Provider: Patient, No Pcp Per  Referring Provider: No ref. provider found  Chief Complaint:  Chief Complaint  Patient presents with  . Discuss birth control    History of Present Illness: Stephanie Simon is a 22 y.o. Caucasian G1P1001 (LMP: 06/21/2017), seen for the above chief complaint.   Hasn't been on anything since delivering in late November 2018. Patient interested in pills. Never has been on birth control before. Patient breastfeeding. No sex since LMP  Review of Systems: as noted in the History of Present Illness.  Past Medical History:  Past Medical History:  Diagnosis Date  . Cardiac murmur 03/28/2013   ECHO 05/13/2013 NORMAL ECHO.  Benign Flow murmur. Heard best in the aortic space and LEFT carotid.  Increases with increased pressure.     Past Surgical History:  Past Surgical History:  Procedure Laterality Date  . WISDOM TOOTH EXTRACTION  06/2015    Past Obstetrical History:  OB History  Gravida Para Term Preterm AB Living  1 1 1     1   SAB TAB Ectopic Multiple Live Births        0 1    # Outcome Date GA Lbr Len/2nd Weight Sex Delivery Anes PTL Lv  1 Term 03/10/17 8875w1d 20:45 / 02:21 9 lb 3.8 oz (4.19 kg) F Vag-Spont EPI  LIV      Past Gynecological History: As per HPI.  Social History:  Social History   Socioeconomic History  . Marital status: Single    Spouse name: n/a  . Number of children: 0  . Years of education: Not on file  . Highest education level: Not on file  Social Needs  . Financial resource strain: Not on file  . Food insecurity - worry: Not on file  . Food insecurity - inability: Not on file  . Transportation needs - medical: Not on file  . Transportation needs - non-medical: Not on file  Occupational History  . Occupation: student    Comment: [employer]  Tobacco Use  .  Smoking status: Never Smoker  . Smokeless tobacco: Never Used  Substance and Sexual Activity  . Alcohol use: No  . Drug use: No  . Sexual activity: Yes    Partners: Male    Birth control/protection: None  Other Topics Concern  . Not on file  Social History Narrative   Lives with her parents.  She has 7 half-sisters and one half-brother.    Family History:  Family History  Problem Relation Age of Onset  . Cancer Paternal Aunt   . Anemia Mother   . Diabetes Father   . Arthritis Father        gout  . Anemia Father   . Hypertension Father   . Heart disease Maternal Grandfather     Medications Stephanie KirschnerMaiya Simon had no medications administered during this visit. Current Outpatient Medications  Medication Sig Dispense Refill  . flintstones complete (FLINTSTONES) 60 MG chewable tablet Chew 1 tablet by mouth daily.     No current facility-administered medications for this visit.     Allergies Patient has no known allergies.   Physical Exam:  BP 120/72   Pulse 98   Wt 172 lb (78 kg)   BMI 31.46 kg/m  Body mass index is 31.46 kg/m. General appearance: Well nourished, well developed female in no acute distress.   Laboratory: none  Radiology:  none  Assessment: pt doing well  Plan:  1. Encounter for initial prescription of contraceptive pills Options d/w her and she denies h/o VTE or migraines. Estrogen risks d/w her and pt would like OCPs. Will start lo loestrin. Pt told can start today and wait a week before considering it effective.   RTC PRN  Cornelia Copa MD Attending [facility] [employer] [facility])

## 2017-06-24 ENCOUNTER — Encounter: Payer: Self-pay | Admitting: Obstetrics and Gynecology

## 2018-04-16 IMAGING — US US MFM FETAL BPP W/O NON-STRESS
1 series · 15 of 28 positions shown · non-contrast
Comparison: none

[Series 1: us mfm fetal bpp w/o non-stress · 29 acquisitions, 15 frames shown]
[im 1/29]
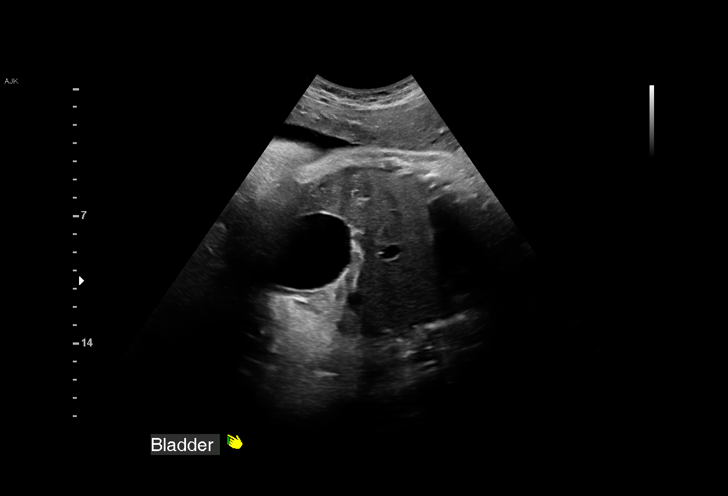
[im 3/29]
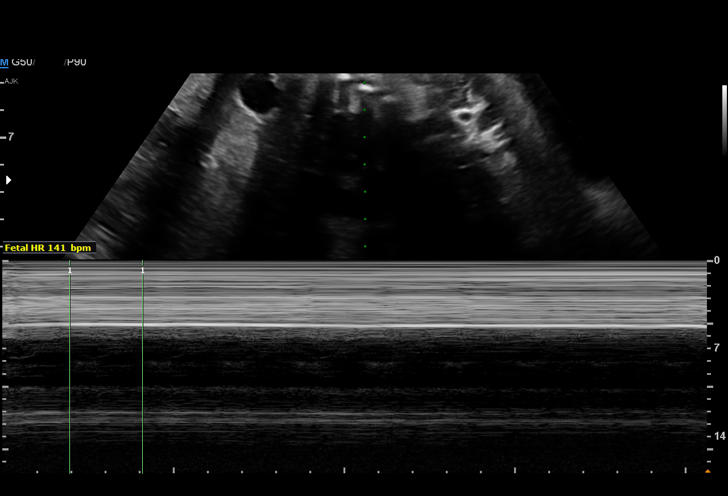
[im 5/29]
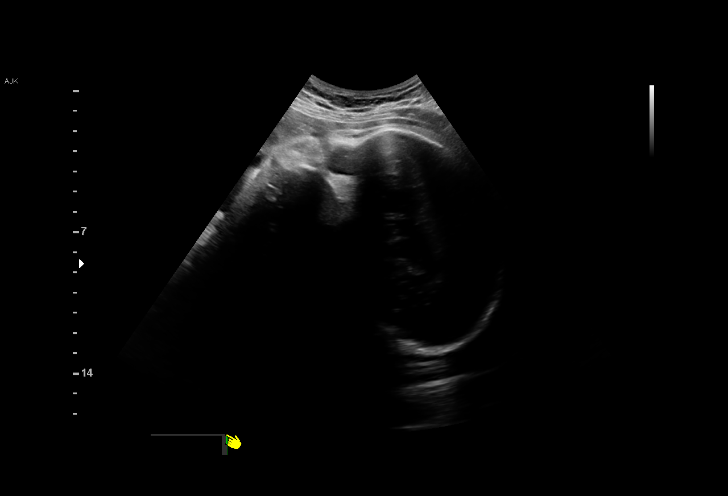
[im 7/29]
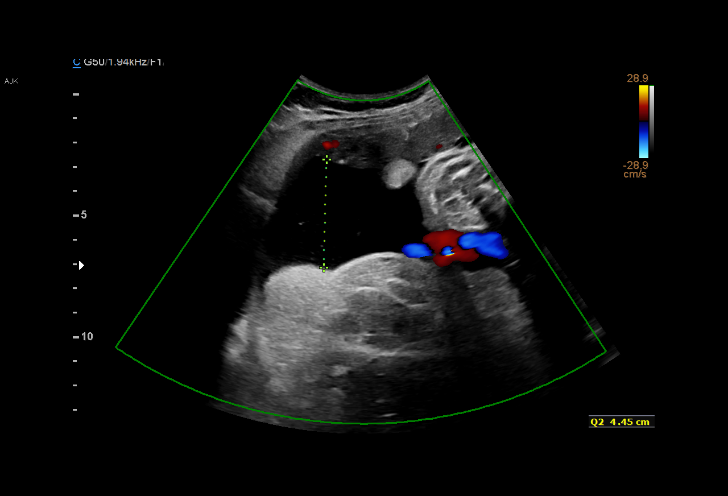
[im 9/29]
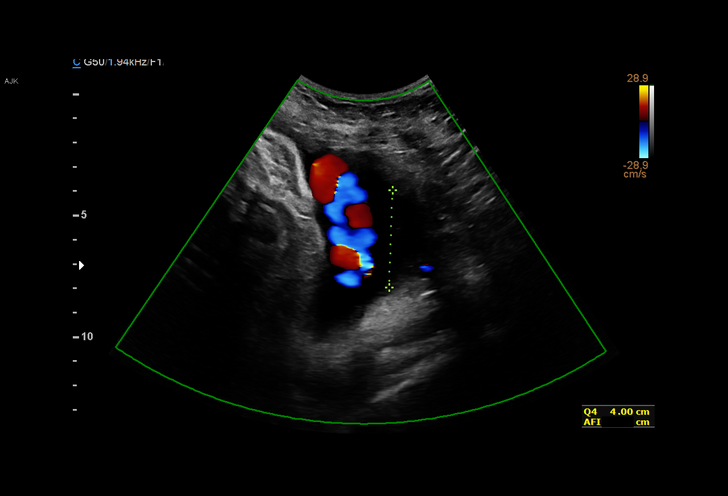
[im 11/29]
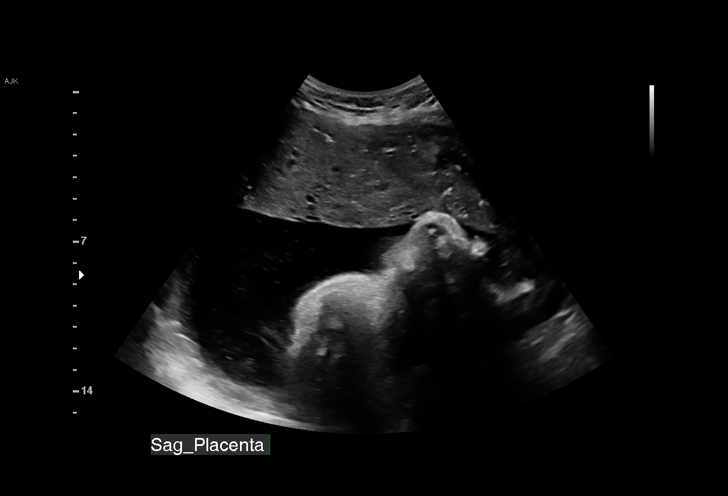
[im 13/29]
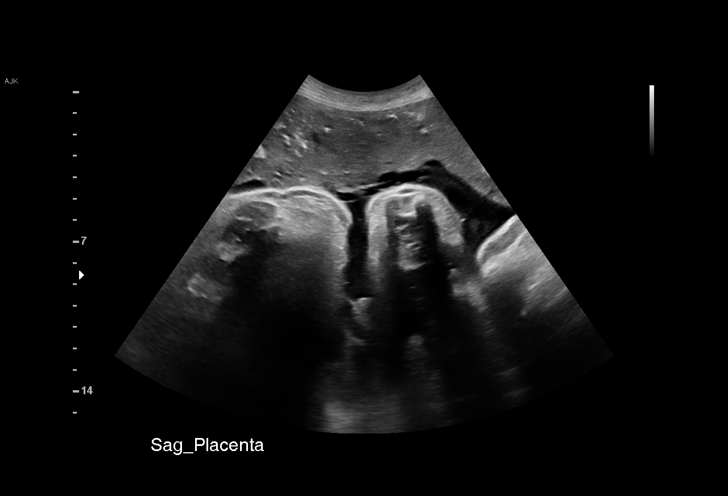
[im 15/29]
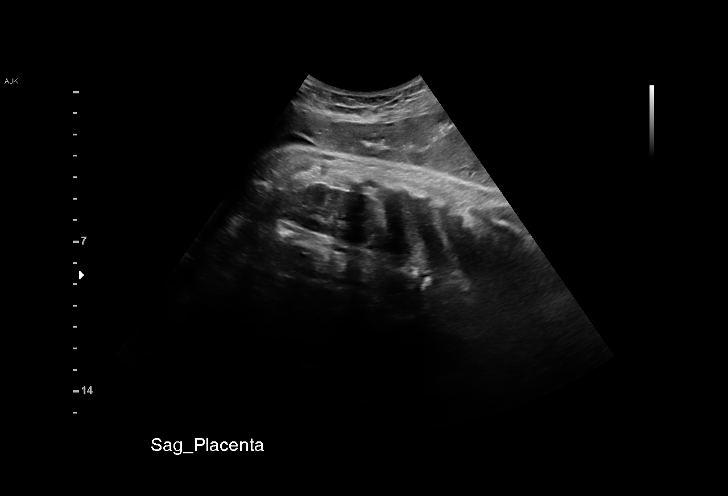
[im 16/29]
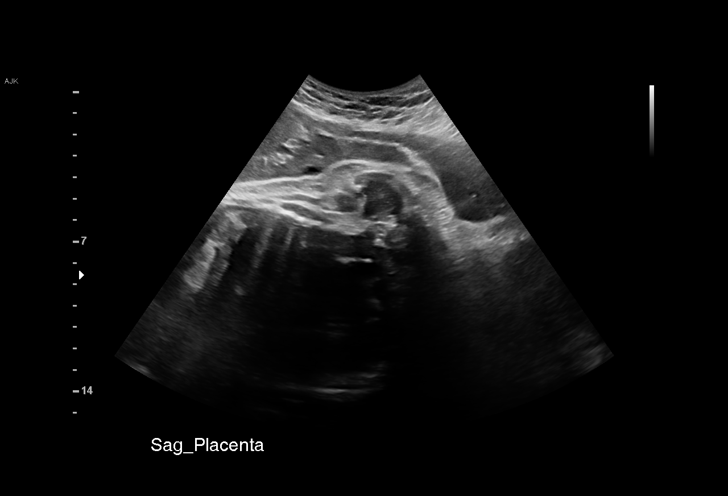
[im 18/29]
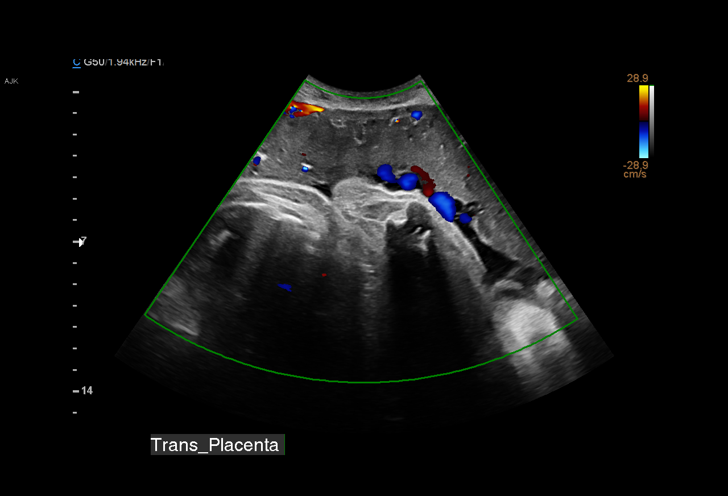
[im 20/29]
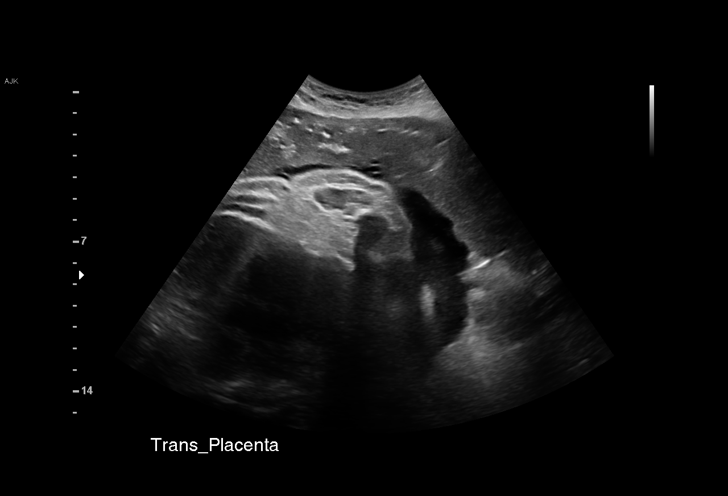
[im 22/29]
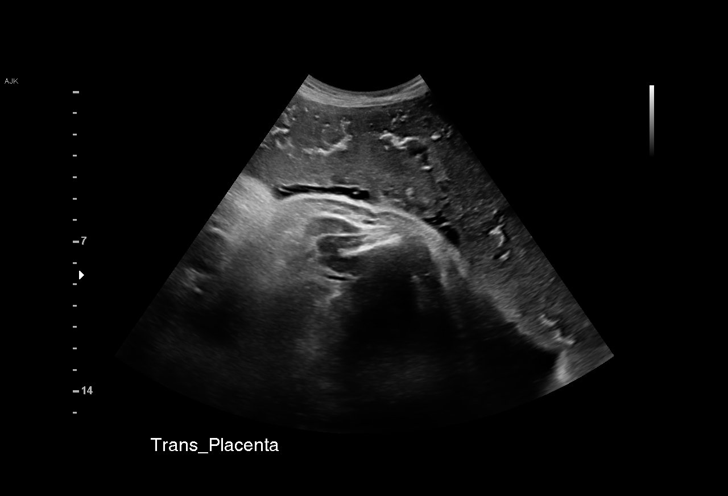
[im 24/29]
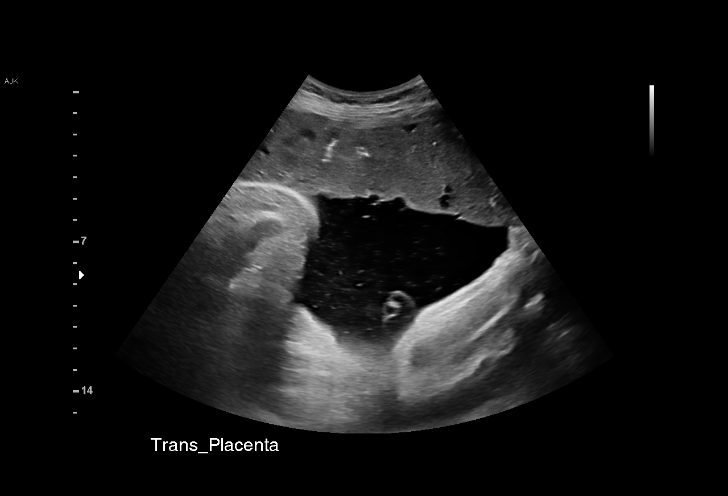
[im 26/29]
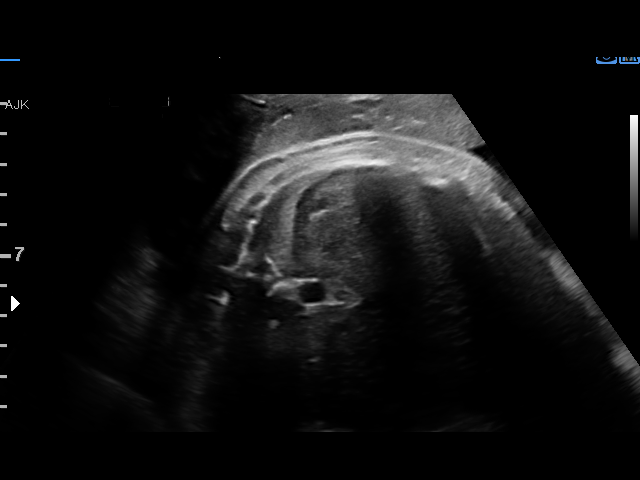
[im 29/29]
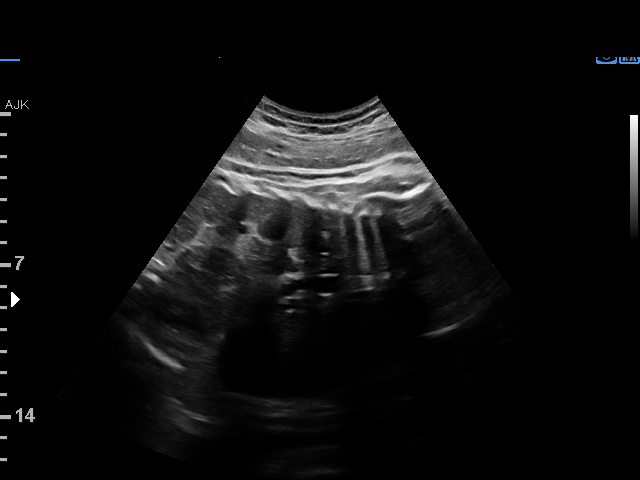

[15 of 28 positions shown; findings below may reference images not displayed]

1  Labang Adeeq               [id number]      [id number]     [id number]
Indications

40 weeks gestation of pregnancy
Non-reactive NST
Postdate pregnancy (40-42 weeks)
OB History

Gravidity:    1
Fetal Evaluation

Num Of Fetuses:     1
Fetal Heart         141
Rate(bpm):
Cardiac Activity:   Observed
Presentation:       Cephalic
Placenta:           Anterior, above cervical os
P. Cord Insertion:  Visualized, central

Amniotic Fluid
AFI FV:      Subjectively within normal limits

AFI Sum(cm)     %Tile       Largest Pocket(cm)
19.2            87

RUQ(cm)       RLQ(cm)       LUQ(cm)        LLQ(cm)
6.14          4
Biophysical Evaluation

Amniotic F.V:   Within normal limits       F. Tone:         Observed
F. Movement:    Observed                   Score:           [DATE]
F. Breathing:   Observed
Gestational Age

LMP:           40w 2d        Date:  05/26/16                 EDD:    03/02/17
Best:          40w 2d     Det. By:  LMP  (05/26/16)          EDD:    03/02/17
Impression

SIUP at 40+2 weeks
Cephalic presentation
Normal amniotic fluid volume
BPP [DATE]
Recommendations

Follow-up as clinically indicated

## 2018-04-27 ENCOUNTER — Encounter: Payer: Self-pay | Admitting: Radiology

## 2018-06-07 ENCOUNTER — Ambulatory Visit (INDEPENDENT_AMBULATORY_CARE_PROVIDER_SITE_OTHER): Payer: Medicaid Other | Admitting: Obstetrics and Gynecology

## 2018-06-07 ENCOUNTER — Encounter: Payer: Self-pay | Admitting: Obstetrics and Gynecology

## 2018-06-07 VITALS — BP 115/76 | HR 72 | Wt 160.4 lb

## 2018-06-07 DIAGNOSIS — Z0000 Encounter for general adult medical examination without abnormal findings: Secondary | ICD-10-CM

## 2018-06-07 DIAGNOSIS — Z3202 Encounter for pregnancy test, result negative: Secondary | ICD-10-CM

## 2018-06-07 DIAGNOSIS — N939 Abnormal uterine and vaginal bleeding, unspecified: Secondary | ICD-10-CM

## 2018-06-07 DIAGNOSIS — Z30019 Encounter for initial prescription of contraceptives, unspecified: Secondary | ICD-10-CM

## 2018-06-07 DIAGNOSIS — Z87898 Personal history of other specified conditions: Secondary | ICD-10-CM

## 2018-06-07 LAB — POCT URINE PREGNANCY: Preg Test, Ur: NEGATIVE

## 2018-06-07 NOTE — Progress Notes (Signed)
Patient would like to switch birth control method from pills to depo.

## 2018-06-07 NOTE — Progress Notes (Signed)
Obstetrics and Gynecology Annual Patient Evaluation  Appointment Date: 06/07/2018  OBGYN Clinic: [facility]  Primary Care Provider: Patient, No Pcp Per  Referring Provider: No ref. provider found  Chief Complaint:  Chief Complaint  Patient presents with  . Gynecologic Exam    History of Present Illness: Stephanie Simon is a 23 y.o. Caucasian G1P1001 (Patient's last menstrual period was 05/29/2018.), seen for the above chief complaint. Her past medical history is significant for nothing   Patient endorses AUB since delivery. Patient last took OCPs last week and sex last week. Patient has been on lo loestrin since march 2019  Patient also endorses able to express white/clear discharge from b/l nipples. No black, red, green discharge and no pain or masses.   No fevers, chills, chest pain, SOB, nausea, vomiting, abdominal pain, dysuria, hematuria, vaginal itching, dyspareunia, SUI or OAB, diarrhea, constipation, blood in BMs  Review of Systems: as noted in the History of Present Illness.   Past Medical History:  Past Medical History:  Diagnosis Date  . Cardiac murmur 03/28/2013   ECHO 05/13/2013 NORMAL ECHO.  Benign Flow murmur. Heard best in the aortic space and LEFT carotid.  Increases with increased pressure.     Past Surgical History:  Past Surgical History:  Procedure Laterality Date  . WISDOM TOOTH EXTRACTION  06/2015    Past Obstetrical History:  OB History  Gravida Para Term Preterm AB Living  1 1 1     1   SAB TAB Ectopic Multiple Live Births        0 1    # Outcome Date GA Lbr Len/2nd Weight Sex Delivery Anes PTL Lv  1 Term 03/10/17 [redacted]w[redacted]d 20:45 / 02:21 9 lb 3.8 oz (4.19 kg) F Vag-Spont EPI  LIV    Past Gynecological History: As per HPI. Periods: qmonth pt with intermenstrual bleeding History of Pap Smear(s): n/a (age) She is currently using OCPs for contraception.   Social History:  Social History   Socioeconomic History  .  Marital status: Single    Spouse name: n/a  . Number of children: 0  . Years of education: Not on file  . Highest education level: Not on file  Occupational History  . Occupation: Consulting civil engineer    Comment: [employer]  Social Needs  . Financial resource strain: Not on file  . Food insecurity:    Worry: Not on file    Inability: Not on file  . Transportation needs:    Medical: Not on file    Non-medical: Not on file  Tobacco Use  . Smoking status: Never Smoker  . Smokeless tobacco: Never Used  Substance and Sexual Activity  . Alcohol use: No  . Drug use: No  . Sexual activity: Yes    Partners: Male    Birth control/protection: None  Lifestyle  . Physical activity:    Days per week: Not on file    Minutes per session: Not on file  . Stress: Not on file  Relationships  . Social connections:    Talks on phone: Not on file    Gets together: Not on file    Attends religious service: Not on file    Active member of club or organization: Not on file    Attends meetings of clubs or organizations: Not on file    Relationship status: Not on file  . Intimate partner violence:    Fear of current or ex partner: Not on file    Emotionally  abused: Not on file    Physically abused: Not on file    Forced sexual activity: Not on file  Other Topics Concern  . Not on file  Social History Narrative   Lives with her parents.  She has 7 half-sisters and one half-brother.    Family History:  Family History  Problem Relation Age of Onset  . Cancer Paternal Aunt   . Anemia Mother   . Diabetes Father   . Arthritis Father        gout  . Anemia Father   . Hypertension Father   . Heart disease Maternal Grandfather     Medications Loraina Llanos had no medications administered during this visit. Current Outpatient Medications  Medication Sig Dispense Refill  . flintstones complete (FLINTSTONES) 60 MG chewable tablet Chew 1 tablet by mouth daily.    . LO LOESTRIN FE 1 MG-10 MCG / 10 MCG  tablet Take 1 tablet by mouth daily. 3 Package 3   No current facility-administered medications for this visit.     Allergies Patient has no known allergies.   Physical Exam:  BP 115/76   Pulse 72   Wt 160 lb 6.4 oz (72.8 kg)   LMP 05/29/2018   BMI 29.34 kg/m  Body mass index is 29.34 kg/m. General appearance: Well nourished, well developed female in no acute distress.  Neck:  Supple, normal appearance, and no thyromegaly  Cardiovascular: normal s1 and s2.  No murmurs, rubs or gallops. Respiratory:  Clear to auscultation bilateral. Normal respiratory effort Abdomen: positive bowel sounds and no masses, hernias; diffusely non tender to palpation, non distended Breasts: on right and left nipples, i'm able to express pinpoint clear d/c from one duct. breasts appear normal, no suspicious masses, no skin or nipple changes or axillary nodes, normal palpation. Neuro/Psych:  Normal mood and affect.  Skin:  Warm and dry.  Lymphatic:  No inguinal lymphadenopathy.   Pelvic exam: is not limited by body habitus EGBUS: within normal limits, Vagina: within normal limits and with no blood or discharge in the vault, Cervix: normal appearing cervix without tenderness, discharge or lesions. Uterus:  nonenlarged and non tender and Adnexa:  normal adnexa and no mass, fullness, tenderness Rectovaginal: deferred  Laboratory: UPT negative  Radiology: none  Assessment: pt stable  Plan:  1. GYN Start paps at age 47 or if AUB continues at next visit, consider earlier pap and STI swab  2. Contraception management, AUB D/w her that recommend either trying new OCP with higher estrogen dosage or trying a new method and if AUB persists in 3-23m then do work up with u/s and in office uterine cavity evaluation or could do work up now. Pt would like to switch to different method via depo and come back in 50m for follow up. Pt to come back next week for UPT and depo provera   3. Nipple Discharge Non  concerning. Recommend decrease manipulation and if characteristics change to call us and let us know.   RTC 81m for f/u and potential rpt depo shot  [place] Bing, Montez Hageman MD Attending Center for [facility])

## 2018-06-14 ENCOUNTER — Ambulatory Visit (INDEPENDENT_AMBULATORY_CARE_PROVIDER_SITE_OTHER): Payer: Medicaid Other

## 2018-06-14 VITALS — BP 96/66

## 2018-06-14 DIAGNOSIS — Z3202 Encounter for pregnancy test, result negative: Secondary | ICD-10-CM | POA: Diagnosis not present

## 2018-06-14 DIAGNOSIS — Z3042 Encounter for surveillance of injectable contraceptive: Secondary | ICD-10-CM | POA: Diagnosis not present

## 2018-06-14 MED ORDER — MEDROXYPROGESTERONE ACETATE 150 MG/ML IM SUSP
150.0000 mg | Freq: Once | INTRAMUSCULAR | Status: AC
  Administered 2018-06-14: 150 mg via INTRAMUSCULAR

## 2018-06-14 NOTE — Progress Notes (Signed)
Stephanie Simon presents today for UPT and depo. No complaints at this time. LMP: 05/29/2018    OBJECTIVE: Appears well, in no apparent distress.  OB History    Gravida  1   Para  1   Term  1   Preterm      AB      Living  1     SAB      TAB      Ectopic      Multiple  0   Live Births  1          Home UPT Result: not done  In-Office UPT result: negative  I have reviewed the patient's medical, obstetrical, social, and family histories, and medications.   ASSESSMENT: Negative pregnancy test  PLAN Patient pregnancy test is negative at this time.She reports not having unprotected sex in the last two weeks. Depo provera administer today in left arm IM. NDC# [phone]

## 2018-06-15 DIAGNOSIS — N939 Abnormal uterine and vaginal bleeding, unspecified: Secondary | ICD-10-CM | POA: Insufficient documentation

## 2018-06-15 NOTE — Progress Notes (Signed)
I have reviewed the chart and agree with nursing staff's documentation of this patient's encounter.  Jaynie Collins, MD 06/15/2018 8:18 AM

## 2018-06-22 ENCOUNTER — Encounter: Payer: Self-pay | Admitting: Radiology

## 2018-08-30 ENCOUNTER — Ambulatory Visit: Payer: Medicaid Other

## 2018-09-14 ENCOUNTER — Other Ambulatory Visit: Payer: Self-pay | Admitting: Obstetrics and Gynecology

## 2018-09-14 ENCOUNTER — Other Ambulatory Visit: Payer: Self-pay

## 2018-09-14 ENCOUNTER — Ambulatory Visit (INDEPENDENT_AMBULATORY_CARE_PROVIDER_SITE_OTHER): Payer: Medicaid Other | Admitting: Obstetrics and Gynecology

## 2018-09-14 ENCOUNTER — Encounter: Payer: Self-pay | Admitting: Obstetrics and Gynecology

## 2018-09-14 VITALS — BP 108/71 | HR 69 | Wt 165.0 lb

## 2018-09-14 DIAGNOSIS — Z3042 Encounter for surveillance of injectable contraceptive: Secondary | ICD-10-CM

## 2018-09-14 DIAGNOSIS — N920 Excessive and frequent menstruation with regular cycle: Secondary | ICD-10-CM

## 2018-09-14 DIAGNOSIS — Z30014 Encounter for initial prescription of intrauterine contraceptive device: Secondary | ICD-10-CM | POA: Diagnosis not present

## 2018-09-14 HISTORY — DX: Excessive and frequent menstruation with regular cycle: N92.0

## 2018-09-14 LAB — POCT URINE PREGNANCY: Preg Test, Ur: NEGATIVE

## 2018-09-14 MED ORDER — MEDROXYPROGESTERONE ACETATE 150 MG/ML IM SUSP
150.0000 mg | Freq: Once | INTRAMUSCULAR | Status: AC
  Administered 2018-09-14: 150 mg via INTRAMUSCULAR

## 2018-09-14 NOTE — Progress Notes (Signed)
Would like a paragard IUD, last depo was 06/14/2018

## 2018-09-14 NOTE — Progress Notes (Signed)
Obstetrics and Gynecology Visit Return Patient Evaluation  Appointment Date: 09/14/2018  Primary Care Provider: Patient, No Pcp Per  OBGYN Clinic: [facility]  Chief Complaint: menorrhagia  History of Present Illness:  Stephanie Simon is a 23 y.o. seen previously for heavy periods. Patient stopped OCPs and switched to depo 72m ago. She has qmonth, regular periods with no intermenstrual bleeding but has a period that lasts for two weeks. She is interested in the paragard  Review of Systems: as noted in the History of Present Illness.  Medications: None  Allergies: has No Known Allergies.  Physical Exam:  BP 108/71   Pulse 69   Wt 165 lb (74.8 kg)   LMP 09/08/2018 (Approximate)   BMI 30.18 kg/m  Body mass index is 30.18 kg/m. General appearance: Well nourished, well developed female in no acute distress.    Assessment: pt stable  Plan:  1. Menorrhagia with regular cycle D/w pt that I don't recommend a paragard given her bleeding profile. I recommend getting an u/s and getting another depo provera in the interim. May do in office sis versus hysteroscopy, d&c as outpatient surgery    - POCT urine pregnancy   RTC: after u/s  Cornelia Copa MD Attending Center for [facility])

## 2018-09-23 ENCOUNTER — Ambulatory Visit (HOSPITAL_COMMUNITY)
Admission: RE | Admit: 2018-09-23 | Discharge: 2018-09-23 | Disposition: A | Discharge status: Home / Self Care | Payer: Medicaid Other | Source: Ambulatory Visit | Attending: Obstetrics and Gynecology | Admitting: Obstetrics and Gynecology

## 2018-09-23 ENCOUNTER — Other Ambulatory Visit: Payer: Self-pay

## 2018-09-23 DIAGNOSIS — N920 Excessive and frequent menstruation with regular cycle: Secondary | ICD-10-CM | POA: Insufficient documentation

## 2018-10-07 ENCOUNTER — Ambulatory Visit: Payer: Medicaid Other | Admitting: Obstetrics and Gynecology

## 2018-10-07 ENCOUNTER — Telehealth: Payer: Self-pay

## 2018-10-07 NOTE — Telephone Encounter (Signed)
Called and spoke with patient for COVID 19 Screening. Patient had no risk factors and is cleared to come into office for appointment. Thanks! 

## 2018-10-08 ENCOUNTER — Encounter: Payer: Self-pay | Admitting: Family Medicine

## 2018-10-08 ENCOUNTER — Other Ambulatory Visit: Payer: Self-pay

## 2018-10-08 ENCOUNTER — Ambulatory Visit (INDEPENDENT_AMBULATORY_CARE_PROVIDER_SITE_OTHER): Payer: Medicaid Other | Admitting: Family Medicine

## 2018-10-08 VITALS — BP 116/55 | HR 55 | Temp 98.0°F | Resp 14 | Ht 62.0 in | Wt 164.0 lb

## 2018-10-08 DIAGNOSIS — Z3202 Encounter for pregnancy test, result negative: Secondary | ICD-10-CM | POA: Diagnosis not present

## 2018-10-08 DIAGNOSIS — F419 Anxiety disorder, unspecified: Secondary | ICD-10-CM | POA: Diagnosis not present

## 2018-10-08 LAB — POCT URINE PREGNANCY: Preg Test, Ur: NEGATIVE

## 2018-10-08 MED ORDER — BUSPIRONE HCL 7.5 MG PO TABS: 7.5000 mg | ORAL_TABLET | Freq: Two times a day (BID) | ORAL | 0 refills | Status: AC

## 2018-10-08 NOTE — Progress Notes (Signed)
[facility]  New Patient Encounter Provider: Mike Gipndre Jakyrah Holladay, FNP    XLK:[id number]  DGU:[id number]  DOB - [birth year]  SUBJECTIVE:   Stephanie Simon, is a 23 y.o. female who presents to establish care with this clinic. History of heart murmur.   Current problems/concerns:  Patient states that she is having feelings of being overwhelmed and irritable. She states that she has been getting frustrated easily over the past 2 years. She states that the symptoms started when she became pregnant. She denies significant family history of mental illness. She denies mania, inpatient hospitalizations or previous treatment for her symptoms. She is not currently pregnant or lactating. She is currently on depo-provera.  At the present time, she denies psychosis, delusional thinking, suicidal/homicidal ideations intent or plan.    No Known Allergies Past Medical History:  Diagnosis Date  . Cardiac murmur 03/28/2013   ECHO 05/13/2013 NORMAL ECHO.  Benign Flow murmur. Heard best in the aortic space and LEFT carotid.  Increases with increased pressure.    Current Outpatient Medications on File Prior to Visit  Medication Sig Dispense Refill  . medroxyPROGESTERone (DEPO-PROVERA) 150 MG/ML injection Inject 150 mg into the muscle every 3 (three) months.     No current facility-administered medications on file prior to visit.    Family History  Problem Relation Age of Onset  . Cancer Paternal Aunt   . Anemia Mother   . Diabetes Father   . Arthritis Father        gout  . Anemia Father   . Hypertension Father   . Heart disease Maternal Grandfather    Social History   Socioeconomic History  . Marital status: Single    Spouse name: n/a  . Number of children: 0  . Years of education: Not on file  . Highest education level: Not on file  Occupational History  . Occupation: Consulting civil engineerstudent    Comment: [employer]  Social Needs  . Financial resource strain:  Not on file  . Food insecurity    Worry: Not on file    Inability: Not on file  . Transportation needs    Medical: Not on file    Non-medical: Not on file  Tobacco Use  . Smoking status: Never Smoker  . Smokeless tobacco: Never Used  Substance and Sexual Activity  . Alcohol use: No  . Drug use: No  . Sexual activity: Yes    Partners: Male    Birth control/protection: None, Injection  Lifestyle  . Physical activity    Days per week: Not on file    Minutes per session: Not on file  . Stress: Not on file  Relationships  . Social Musicianconnections    Talks on phone: Not on file    Gets together: Not on file    Attends religious service: Not on file    Active member of club or organization: Not on file    Attends meetings of clubs or organizations: Not on file    Relationship status: Not on file  . Intimate partner violence    Fear of current or ex partner: Not on file    Emotionally abused: Not on file    Physically abused: Not on file    Forced sexual activity: Not on file  Other Topics Concern  . Not on file  Social History Narrative   Lives with her parents.  She has 7 half-sisters and one half-brother.    Review of Systems  Constitutional: Negative.   HENT: Negative.   Eyes: Negative.   Respiratory: Negative.   Cardiovascular: Negative.   Gastrointestinal: Negative.   Genitourinary: Negative.   Musculoskeletal: Negative.   Skin: Negative.   Neurological: Negative.   Psychiatric/Behavioral: The patient is nervous/anxious.      OBJECTIVE:    BP (!) 116/55 (BP Location: Left Arm, Patient Position: Sitting, Cuff Size: Normal)   Pulse (!) 55   Temp 98 F (36.7 C) (Oral)   Resp 14   Ht 5\' 2"  (1.575 m)   Wt 164 lb (74.4 kg)   LMP 10/01/2018   SpO2 100%   BMI 30.00 kg/m   Physical Exam  Constitutional: She is oriented to person, place, and time and well-developed, well-nourished, and in no distress. No distress.  HENT:  Head: Normocephalic and atraumatic.   Eyes: Pupils are equal, round, and reactive to light. Conjunctivae and EOM are normal.  Neck: Normal range of motion. Neck supple.  Cardiovascular: Normal rate, regular rhythm and intact distal pulses. Exam reveals no gallop and no friction rub.  No murmur heard. Pulmonary/Chest: Effort normal and breath sounds normal. No respiratory distress. She has no wheezes.  Abdominal: Soft. Bowel sounds are normal. There is no abdominal tenderness.  Musculoskeletal: Normal range of motion.        General: No tenderness or edema.  Lymphadenopathy:    She has no cervical adenopathy.  Neurological: She is alert and oriented to person, place, and time. Gait normal.  Skin: Skin is warm and dry.  Psychiatric: Memory, affect and judgment normal. Her mood appears anxious. She does not exhibit a depressed mood. She expresses no homicidal and no suicidal ideation.  Nursing note and vitals reviewed.  GAD 7 : Generalized Anxiety Score 10/08/2018  Nervous, Anxious, on Edge 2  Control/stop worrying 1  Worry too much - different things 0  Trouble relaxing 0  Restless 0  Easily annoyed or irritable 3  Afraid - awful might happen 0  Total GAD 7 Score 6  Anxiety Difficulty Not difficult at all     Office Visit from 10/08/2018 in [place]  PHQ-2 Total Score  0        ASSESSMENT/PLAN:  1. Anxiety Will check for thyroid cause. Start on buspar for anxiety and irritability.  - busPIRone (BUSPAR) 7.5 MG tablet; Take 1 tablet (7.5 mg total) by mouth 2 (two) times daily for 30 days.  Dispense: 60 tablet; Refill: 0 - CBC with Differential - Comprehensive metabolic panel - Thyroid Panel With TSH - POCT urine pregnancy   Return in about 3 weeks (around 10/29/2018) for New medication.  The patient was given clear instructions to go to ER or return to medical center if symptoms don't improve, worsen or new problems develop. The patient verbalized understanding. The patient was told to call  to get lab results if they haven't heard anything in the next week.     This note has been created with Surveyor, quantity. Any transcriptional errors are unintentional.   [name], FNP-BC [facility] [facility] [address] [place], [place] [address] [phone]

## 2018-10-08 NOTE — Patient Instructions (Signed)
Living With Anxiety    After being diagnosed with an anxiety disorder, you may be relieved to know why you have felt or behaved a certain way. It is natural to also feel overwhelmed about the treatment ahead and what it will mean for your life. With care and support, you can manage this condition and recover from it.  How to cope with anxiety  Dealing with stress  Stress is your body’s reaction to life changes and events, both good and bad. Stress can last just a few hours or it can be ongoing. Stress can play a major role in anxiety, so it is important to learn both how to cope with stress and how to think about it differently.  Talk with your health care provider or a counselor to learn more about stress reduction. He or she may suggest some stress reduction techniques, such as:  · Music therapy. This can include creating or listening to music that you enjoy and that inspires you.  · Mindfulness-based meditation. This involves being aware of your normal breaths, rather than trying to control your breathing. It can be done while sitting or walking.  · Centering prayer. This is a kind of meditation that involves focusing on a word, phrase, or sacred image that is meaningful to you and that brings you peace.  · Deep breathing. To do this, expand your stomach and inhale slowly through your nose. Hold your breath for 3-5 seconds. Then exhale slowly, allowing your stomach muscles to relax.  · Self-talk. This is a skill where you identify thought patterns that lead to anxiety reactions and correct those thoughts.  · Muscle relaxation. This involves tensing muscles then relaxing them.  Choose a stress reduction technique that fits your lifestyle and personality. Stress reduction techniques take time and practice. Set aside 5-15 minutes a day to do them. Therapists can offer training in these techniques. The training may be covered by some insurance plans. Other things you can do to manage stress include:  · Keeping a  stress diary. This can help you learn what triggers your stress and ways to control your response.  · Thinking about how you respond to certain situations. You may not be able to control everything, but you can control your reaction.  · Making time for activities that help you relax, and not feeling guilty about spending your time in this way.  Therapy combined with coping and stress-reduction skills provides the best chance for successful treatment.  Medicines  Medicines can help ease symptoms. Medicines for anxiety include:  · Anti-anxiety drugs.  · Antidepressants.  · Beta-blockers.  Medicines may be used as the main treatment for anxiety disorder, along with therapy, or if other treatments are not working. Medicines should be prescribed by a health care provider.  Relationships  Relationships can play a big part in helping you recover. Try to spend more time connecting with trusted friends and family members. Consider going to couples counseling, taking family education classes, or going to family therapy. Therapy can help you and others better understand the condition.  How to recognize changes in your condition  Everyone has a different response to treatment for anxiety. Recovery from anxiety happens when symptoms decrease and stop interfering with your daily activities at home or work. This may mean that you will start to:  · Have better concentration and focus.  · Sleep better.  · Be less irritable.  · Have more energy.  · Have improved memory.  It is   important to recognize when your condition is getting worse. Contact your health care provider if your symptoms interfere with home or work and you do not feel like your condition is improving.  Where to find help and support:  You can get help and support from these sources:  · Self-help groups.  · Online and community organizations.  · A trusted spiritual leader.  · Couples counseling.  · Family education classes.  · Family therapy.  Follow these instructions  at home:  · Eat a healthy diet that includes plenty of vegetables, fruits, whole grains, low-fat dairy products, and lean protein. Do not eat a lot of foods that are high in solid fats, added sugars, or salt.  · Exercise. Most adults should do the following:  ? Exercise for at least 150 minutes each week. The exercise should increase your heart rate and make you sweat (moderate-intensity exercise).  ? Strengthening exercises at least twice a week.  · Cut down on caffeine, tobacco, alcohol, and other potentially harmful substances.  · Get the right amount and quality of sleep. Most adults need 7-9 hours of sleep each night.  · Make choices that simplify your life.  · Take over-the-counter and prescription medicines only as told by your health care provider.  · Avoid caffeine, alcohol, and certain over-the-counter cold medicines. These may make you feel worse. Ask your pharmacist which medicines to avoid.  · Keep all follow-up visits as told by your health care provider. This is important.  Questions to ask your health care provider  · Would I benefit from therapy?  · How often should I follow up with a health care provider?  · How long do I need to take medicine?  · Are there any long-term side effects of my medicine?  · Are there any alternatives to taking medicine?  Contact a health care provider if:  · You have a hard time staying focused or finishing daily tasks.  · You spend many hours a day feeling worried about everyday life.  · You become exhausted by worry.  · You start to have headaches, feel tense, or have nausea.  · You urinate more than normal.  · You have diarrhea.  Get help right away if:  · You have a racing heart and shortness of breath.  · You have thoughts of hurting yourself or others.  If you ever feel like you may hurt yourself or others, or have thoughts about taking your own life, get help right away. You can go to your nearest emergency department or call:  · Your local emergency services  (911 in the [place]  · A suicide crisis helpline, such as the National Suicide Prevention Lifeline at 1-800-273-8255. This is open 24-hours a day.  Summary  · Taking steps to deal with stress can help calm you.  · Medicines cannot cure anxiety disorders, but they can help ease symptoms.  · Family, friends, and partners can play a big part in helping you recover from an anxiety disorder.  This information is not intended to replace advice given to you by your health care provider. Make sure you discuss any questions you have with your health care provider.  Document Released: 03/25/2016 Document Revised: 03/25/2016 Document Reviewed: 03/25/2016  Elsevier Interactive Patient Education © 2019 [employer]

## 2018-10-09 LAB — COMPREHENSIVE METABOLIC PANEL
ALT: 14 IU/L (ref 0–32)
AST: 17 IU/L (ref 0–40)
Albumin/Globulin Ratio: 1.8 (ref 1.2–2.2)
Albumin: 4.6 g/dL (ref 3.9–5.0)
Alkaline Phosphatase: 61 IU/L (ref 39–117)
BUN/Creatinine Ratio: 16 (ref 9–23)
BUN: 13 mg/dL (ref 6–20)
Bilirubin Total: 0.3 mg/dL (ref 0.0–1.2)
CO2: 20 mmol/L (ref 20–29)
Calcium: 9.5 mg/dL (ref 8.7–10.2)
Chloride: 110 mmol/L — ABNORMAL HIGH (ref 96–106)
Creatinine, Ser: 0.83 mg/dL (ref 0.57–1.00)
GFR calc Af Amer: 117 mL/min/{1.73_m2} (ref >59)
GFR calc non Af Amer: 102 mL/min/{1.73_m2} (ref >59)
Globulin, Total: 2.5 g/dL (ref 1.5–4.5)
Glucose: 81 mg/dL (ref 65–99)
Potassium: 4.6 mmol/L (ref 3.5–5.2)
Sodium: 142 mmol/L (ref 134–144)
Total Protein: 7.1 g/dL (ref 6.0–8.5)

## 2018-10-09 LAB — CBC WITH DIFFERENTIAL/PLATELET
Basophils Absolute: 0.1 10*3/uL (ref 0.0–0.2)
Basos: 1 %
EOS (ABSOLUTE): 0.1 10*3/uL (ref 0.0–0.4)
Eos: 1 %
Hematocrit: 37.5 % (ref 34.0–46.6)
Hemoglobin: 11.3 g/dL (ref 11.1–15.9)
Immature Grans (Abs): 0 10*3/uL (ref 0.0–0.1)
Immature Granulocytes: 0 %
Lymphocytes Absolute: 3.4 10*3/uL — ABNORMAL HIGH (ref 0.7–3.1)
Lymphs: 40 %
MCH: 22.3 pg — ABNORMAL LOW (ref 26.6–33.0)
MCHC: 30.1 g/dL — ABNORMAL LOW (ref 31.5–35.7)
MCV: 74 fL — ABNORMAL LOW (ref 79–97)
Monocytes Absolute: 0.6 10*3/uL (ref 0.1–0.9)
Monocytes: 7 %
Neutrophils Absolute: 4.3 10*3/uL (ref 1.4–7.0)
Neutrophils: 51 %
Platelets: 315 10*3/uL (ref 150–450)
RBC: 5.07 x10E6/uL (ref 3.77–5.28)
RDW: 17.7 % — ABNORMAL HIGH (ref 11.7–15.4)
WBC: 8.5 10*3/uL (ref 3.4–10.8)

## 2018-10-09 LAB — THYROID PANEL WITH TSH
Free Thyroxine Index: 1.9 (ref 1.2–4.9)
T3 Uptake Ratio: 31 % (ref 24–39)
T4, Total: 6.2 ug/dL (ref 4.5–12.0)
TSH: 1.52 u[IU]/mL (ref 0.450–4.500)

## 2018-10-29 ENCOUNTER — Ambulatory Visit: Payer: Medicaid Other | Admitting: Family Medicine

## 2018-12-22 ENCOUNTER — Encounter (HOSPITAL_COMMUNITY): Payer: Self-pay

## 2018-12-22 ENCOUNTER — Encounter (HOSPITAL_COMMUNITY): Payer: Self-pay | Admitting: *Deleted

## 2018-12-27 ENCOUNTER — Ambulatory Visit: Payer: Medicaid Other | Admitting: Family Medicine

## 2018-12-28 ENCOUNTER — Other Ambulatory Visit (HOSPITAL_COMMUNITY)
Admission: RE | Admit: 2018-12-28 | Discharge: 2018-12-28 | Disposition: A | Discharge status: Home / Self Care | Payer: Medicaid Other | Source: Ambulatory Visit | Attending: Obstetrics and Gynecology | Admitting: Obstetrics and Gynecology

## 2018-12-28 ENCOUNTER — Other Ambulatory Visit: Payer: Self-pay

## 2018-12-28 ENCOUNTER — Ambulatory Visit (INDEPENDENT_AMBULATORY_CARE_PROVIDER_SITE_OTHER): Payer: Medicaid Other | Admitting: Obstetrics and Gynecology

## 2018-12-28 VITALS — BP 120/75 | HR 103

## 2018-12-28 DIAGNOSIS — N86 Erosion and ectropion of cervix uteri: Secondary | ICD-10-CM | POA: Diagnosis not present

## 2018-12-28 DIAGNOSIS — N939 Abnormal uterine and vaginal bleeding, unspecified: Secondary | ICD-10-CM

## 2018-12-28 DIAGNOSIS — Z124 Encounter for screening for malignant neoplasm of cervix: Secondary | ICD-10-CM | POA: Diagnosis not present

## 2018-12-28 DIAGNOSIS — Z23 Encounter for immunization: Secondary | ICD-10-CM | POA: Diagnosis not present

## 2018-12-28 MED ORDER — LEVONORGESTREL 1.5 MG PO TABS: 1.5000 mg | ORAL_TABLET | Freq: Once | ORAL | 0 refills | Status: AC

## 2018-12-28 NOTE — Progress Notes (Signed)
Obstetrics and Gynecology Visit Return Patient Evaluation  Appointment Date: 12/28/2018  Primary Care Provider: Lanae Simon  OB[facility]N Clinic: [facility]  Chief Complaint: f/u u/s  History of Present Illness:  Stephanie Simon is a 23 y.o. G1P1 with the above CC.  Patient last seen by me in early June 2020 or menorrhagia and set up for u/s. She was started on depo provera about 22m prior from [facility] at that last visit, with plans for u/s and follow up; she missed her 6/25 follow up u/s visit.  She states that her period just ended and it is unchanged. She has long h/o menorrhagia that got somewhat better after her last delivery  Review of Systems: as noted in the History of Present Illness.  Medications:  Stephanie Simon had no medications administered during this visit. Current Outpatient Medications  Medication Sig Dispense Refill  . medroxyPROGESTERone (DEPO-PROVERA) 150 MG/ML injection Inject 150 mg into the muscle every 3 (three) months.     No current facility-administered medications for this visit.     Allergies: has No Known Allergies.  Physical Exam:  BP 120/75   Pulse (!) 103  There is no height or weight on file to calculate BMI. General appearance: Well nourished, well developed female in no acute distress.  Abdomen: diffusely non tender to palpation, non distended, and no masses, hernias Neuro/Psych:  Normal mood and affect.    Pelvic exam:  EGBUS, vaginal vault and cervix: within normal limits but has very prominent ectropion that is somewhat friable with pap smear. No blood in the vault or discharge, no cmt, small mobile uterus. Cervix does seem to sit a little low in the vault than usual in a multip patient.   Labs: UPT negative  Radiology: CLINICAL DATA:  Menorrhagia with regular cycle  EXAM: TRANSABDOMINAL AND TRANSVAGINAL ULTRASOUND OF PELVIS  TECHNIQUE: Both transabdominal and transvaginal ultrasound examinations of the pelvis  were performed. Transabdominal technique was performed for global imaging of the pelvis including uterus, ovaries, adnexal regions, and pelvic cul-de-sac. It was necessary to proceed with endovaginal exam following the transabdominal exam to visualize the endometrium and adnexa.  COMPARISON:  None  FINDINGS: Uterus  Measurements: 7.6 x 3.3 x 5.3 cm = volume: 70 mL. Normal morphology without mass  Endometrium  Thickness: 6 mm.  No endometrial fluid or focal abnormality  Right ovary  Measurements: 3.0 x 2.3 x 2.7 cm = volume: 9.7 mL. Normal morphology without mass  Left ovary  Measurements: 2.9 x 1.9 x 2.8 cm = volume: 8.3 mL. Normal morphology without mass  Other findings  No free pelvic fluid or adnexal masses.  IMPRESSION: Normal exam.   Electronically Signed   By: Stephanie Simon M.D.   On: 09/23/2018 15:41  Assessment: pt stable  Plan: 1. Cervical cancer screening - Cytology - PAP( [facility])  2. Menorrhagia She does endorse some post coital spotting and dyspareunia at times. I told her that I don't believe the prominent ectropion is the cause of her aub but could be the cause of post coital spotting the cervix a little could cause dyspareunia. I told her I recommend trying to avoid certain positions that cause deeper penetration.   Options d/w her and I told her that the depo can cause some changes in periods. She states she may want to try again for another child in at least another year. I told her about larc, etc and told her I'd recommend mirena as first option. Pt would  like to do med free trial to see what her cycles do.    RTC: PRN  Stephanie Simon, Jr MD Attending [facility](Faculty Practice)

## 2018-12-28 NOTE — Progress Notes (Signed)
Flu Shot  Discuss coming off [facility] Never had Pap

## 2018-12-29 DIAGNOSIS — N86 Erosion and ectropion of cervix uteri: Secondary | ICD-10-CM | POA: Insufficient documentation

## 2018-12-30 ENCOUNTER — Ambulatory Visit: Payer: Medicaid Other | Admitting: Family Medicine

## 2018-12-31 LAB — CYTOLOGY - PAP: Diagnosis: NEGATIVE

## 2019-01-05 ENCOUNTER — Telehealth: Payer: Medicaid Other | Admitting: Family Medicine

## 2019-01-25 ENCOUNTER — Telehealth (INDEPENDENT_AMBULATORY_CARE_PROVIDER_SITE_OTHER): Payer: Medicaid Other | Admitting: Family Medicine

## 2019-01-25 ENCOUNTER — Other Ambulatory Visit: Payer: Self-pay

## 2019-01-25 ENCOUNTER — Encounter: Payer: Self-pay | Admitting: Family Medicine

## 2019-01-25 VITALS — BP 111/60 | HR 56 | Temp 98.3°F | Resp 16 | Ht 62.0 in | Wt 166.0 lb

## 2019-01-25 DIAGNOSIS — F411 Generalized anxiety disorder: Secondary | ICD-10-CM | POA: Diagnosis not present

## 2019-01-25 MED ORDER — HYDROXYZINE HCL 10 MG PO TABS: 10.0000 mg | ORAL_TABLET | Freq: Three times a day (TID) | ORAL | 0 refills | Status: DC | PRN

## 2019-01-25 MED ORDER — ESCITALOPRAM OXALATE 10 MG PO TABS: 10.0000 mg | ORAL_TABLET | Freq: Every day | ORAL | 1 refills | Status: DC

## 2019-01-25 NOTE — Patient Instructions (Signed)
Hydroxyzine capsules or tablets What is this medicine? HYDROXYZINE (hye DROX i zeen) is an antihistamine. This medicine is used to treat allergy symptoms. It is also used to treat anxiety and tension. This medicine can be used with other medicines to induce sleep before surgery. This medicine may be used for other purposes; ask your health care provider or pharmacist if you have questions. COMMON BRAND NAME(S): ANX, Atarax, Rezine, Vistaril What should I tell my health care provider before I take this medicine? They need to know if you have any of these conditions:  glaucoma  heart disease  history of irregular heartbeat  kidney disease  liver disease  lung or breathing disease, like asthma  stomach or intestine problems  thyroid disease  trouble passing urine  an unusual or allergic reaction to hydroxyzine, cetirizine, other medicines, foods, dyes or preservatives  pregnant or trying to get pregnant  breast-feeding How should I use this medicine? Take this medicine by mouth with a full glass of water. Follow the directions on the prescription label. You may take this medicine with food or on an empty stomach. Take your medicine at regular intervals. Do not take your medicine more often than directed. Talk to your pediatrician regarding the use of this medicine in children. Special care may be needed. While this drug may be prescribed for children as young as 6 years of age for selected conditions, precautions do apply. Patients over 65 years old may have a stronger reaction and need a smaller dose. Overdosage: If you think you have taken too much of this medicine contact a poison control center or emergency room at once. NOTE: This medicine is only for you. Do not share this medicine with others. What if I miss a dose? If you miss a dose, take it as soon as you can. If it is almost time for your next dose, take only that dose. Do not take double or extra doses. What may  interact with this medicine? Do not take this medicine with any of the following medications:  cisapride  dronedarone  pimozide  thioridazine This medicine may also interact with the following medications:  alcohol  antihistamines for allergy, cough, and cold  atropine  barbiturate medicines for sleep or seizures, like phenobarbital  certain antibiotics like erythromycin or clarithromycin  certain medicines for anxiety or sleep  certain medicines for bladder problems like oxybutynin, tolterodine  certain medicines for depression or psychotic disturbances  certain medicines for irregular heart beat  certain medicines for Parkinson's disease like benztropine, trihexyphenidyl  certain medicines for seizures like phenobarbital, primidone  certain medicines for stomach problems like dicyclomine, hyoscyamine  certain medicines for travel sickness like scopolamine  ipratropium  narcotic medicines for pain  other medicines that prolong the QT interval (an abnormal heart rhythm) like dofetilide This list may not describe all possible interactions. Give your health care provider a list of all the medicines, herbs, non-prescription drugs, or dietary supplements you use. Also tell them if you smoke, drink alcohol, or use illegal drugs. Some items may interact with your medicine. What should I watch for while using this medicine? Tell your doctor or health care professional if your symptoms do not improve. You may get drowsy or dizzy. Do not drive, use machinery, or do anything that needs mental alertness until you know how this medicine affects you. Do not stand or sit up quickly, especially if you are an older patient. This reduces the risk of dizzy or fainting spells. Alcohol may   interfere with the effect of this medicine. Avoid alcoholic drinks. Your mouth may get dry. Chewing sugarless gum or sucking hard candy, and drinking plenty of water may help. Contact your doctor if the  problem does not go away or is severe. This medicine may cause dry eyes and blurred vision. If you wear contact lenses you may feel some discomfort. Lubricating drops may help. See your eye doctor if the problem does not go away or is severe. If you are receiving skin tests for allergies, tell your doctor you are using this medicine. What side effects may I notice from receiving this medicine? Side effects that you should report to your doctor or health care professional as soon as possible:  allergic reactions like skin rash, itching or hives, swelling of the face, lips, or tongue  changes in vision  confusion  fast, irregular heartbeat  seizures  tremor  trouble passing urine or change in the amount of urine Side effects that usually do not require medical attention (report to your doctor or health care professional if they continue or are bothersome):  constipation  drowsiness  dry mouth  headache  tiredness This list may not describe all possible side effects. Call your doctor for medical advice about side effects. You may report side effects to FDA at 1-800-FDA-1088. Where should I keep my medicine? Keep out of the reach of children. Store at room temperature between 15 and 30 degrees C (59 and 86 degrees F). Keep container tightly closed. Throw away any unused medicine after the expiration date. NOTE: This sheet is a summary. It may not cover all possible information. If you have questions about this medicine, talk to your doctor, pharmacist, or health care provider.  2020 Elsevier/Gold Standard (2018-03-22 13:19:55) Escitalopram tablets What is this medicine? ESCITALOPRAM (es sye TAL oh pram) is used to treat depression and certain types of anxiety. This medicine may be used for other purposes; ask your health care provider or pharmacist if you have questions. COMMON BRAND NAME(S): Lexapro What should I tell my health care provider before I take this medicine? They  need to know if you have any of these conditions:  bipolar disorder or a family history of bipolar disorder  diabetes  glaucoma  heart disease  kidney or liver disease  receiving electroconvulsive therapy  seizures (convulsions)  suicidal thoughts, plans, or attempt by you or a family member  an unusual or allergic reaction to escitalopram, the related drug citalopram, other medicines, foods, dyes, or preservatives  pregnant or trying to become pregnant  breast-feeding How should I use this medicine? Take this medicine by mouth with a glass of water. Follow the directions on the prescription label. You can take it with or without food. If it upsets your stomach, take it with food. Take your medicine at regular intervals. Do not take it more often than directed. Do not stop taking this medicine suddenly except upon the advice of your doctor. Stopping this medicine too quickly may cause serious side effects or your condition may worsen. A special MedGuide will be given to you by the pharmacist with each prescription and refill. Be sure to read this information carefully each time. Talk to your pediatrician regarding the use of this medicine in children. Special care may be needed. Overdosage: If you think you have taken too much of this medicine contact a poison control center or emergency room at once. NOTE: This medicine is only for you. Do not share this medicine with others.  What if I miss a dose? If you miss a dose, take it as soon as you can. If it is almost time for your next dose, take only that dose. Do not take double or extra doses. What may interact with this medicine? Do not take this medicine with any of the following medications:  certain medicines for fungal infections like fluconazole, itraconazole, ketoconazole, posaconazole, voriconazole  cisapride  citalopram  dronedarone  linezolid  MAOIs like Carbex, Eldepryl, Marplan, Nardil, and Parnate  methylene  blue (injected into a vein)  pimozide  thioridazine This medicine may also interact with the following medications:  alcohol  amphetamines  aspirin and aspirin-like medicines  carbamazepine  certain medicines for depression, anxiety, or psychotic disturbances  certain medicines for migraine headache like almotriptan, eletriptan, frovatriptan, naratriptan, rizatriptan, sumatriptan, zolmitriptan  certain medicines for sleep  certain medicines that treat or prevent blood clots like warfarin, enoxaparin, dalteparin  cimetidine  diuretics  dofetilide  fentanyl  furazolidone  isoniazid  lithium  metoprolol  NSAIDs, medicines for pain and inflammation, like ibuprofen or naproxen  other medicines that prolong the QT interval (cause an abnormal heart rhythm)  procarbazine  rasagiline  supplements like St. John's wort, kava kava, valerian  tramadol  tryptophan  ziprasidone This list may not describe all possible interactions. Give your health care provider a list of all the medicines, herbs, non-prescription drugs, or dietary supplements you use. Also tell them if you smoke, drink alcohol, or use illegal drugs. Some items may interact with your medicine. What should I watch for while using this medicine? Tell your doctor if your symptoms do not get better or if they get worse. Visit your doctor or health care professional for regular checks on your progress. Because it may take several weeks to see the full effects of this medicine, it is important to continue your treatment as prescribed by your doctor. Patients and their families should watch out for new or worsening thoughts of suicide or depression. Also watch out for sudden changes in feelings such as feeling anxious, agitated, panicky, irritable, hostile, aggressive, impulsive, severely restless, overly excited and hyperactive, or not being able to sleep. If this happens, especially at the beginning of treatment  or after a change in dose, call your health care professional. Dennis Bast may get drowsy or dizzy. Do not drive, use machinery, or do anything that needs mental alertness until you know how this medicine affects you. Do not stand or sit up quickly, especially if you are an older patient. This reduces the risk of dizzy or fainting spells. Alcohol may interfere with the effect of this medicine. Avoid alcoholic drinks. Your mouth may get dry. Chewing sugarless gum or sucking hard candy, and drinking plenty of water may help. Contact your doctor if the problem does not go away or is severe. What side effects may I notice from receiving this medicine? Side effects that you should report to your doctor or health care professional as soon as possible:  allergic reactions like skin rash, itching or hives, swelling of the face, lips, or tongue  anxious  black, tarry stools  changes in vision  confusion  elevated mood, decreased need for sleep, racing thoughts, impulsive behavior  eye pain  fast, irregular heartbeat  feeling faint or lightheaded, falls  feeling agitated, angry, or irritable  hallucination, loss of contact with reality  loss of balance or coordination  loss of memory  painful or prolonged erections  restlessness, pacing, inability to keep still  seizures  stiff muscles  suicidal thoughts or other mood changes  trouble sleeping  unusual bleeding or bruising  unusually weak or tired  vomiting Side effects that usually do not require medical attention (report to your doctor or health care professional if they continue or are bothersome):  changes in appetite  change in sex drive or performance  headache  increased sweating  indigestion, nausea  tremors This list may not describe all possible side effects. Call your doctor for medical advice about side effects. You may report side effects to FDA at 1-800-FDA-1088. Where should I keep my medicine? Keep out of  reach of children. Store at room temperature between 15 and 30 degrees C (59 and 86 degrees F). Throw away any unused medicine after the expiration date. NOTE: This sheet is a summary. It may not cover all possible information. If you have questions about this medicine, talk to your doctor, pharmacist, or health care provider.  2020 Elsevier/Gold Standard (2018-03-22 11:21:44)   Generalized Anxiety Disorder, Adult Generalized anxiety disorder (GAD) is a mental health disorder. People with this condition constantly worry about everyday events. Unlike normal anxiety, worry related to GAD is not triggered by a specific event. These worries also do not fade or get better with time. GAD interferes with life functions, including relationships, work, and school. GAD can vary from mild to severe. People with severe GAD can have intense waves of anxiety with physical symptoms (panic attacks). What are the causes? The exact cause of GAD is not known. What increases the risk? This condition is more likely to develop in:  Women.  People who have a family history of anxiety disorders.  People who are very shy.  People who experience very stressful life events, such as the death of a loved one.  People who have a very stressful family environment. What are the signs or symptoms? People with GAD often worry excessively about many things in their lives, such as their health and family. They may also be overly concerned about:  Doing well at work.  Being on time.  Natural disasters.  Friendships. Physical symptoms of GAD include:  Fatigue.  Muscle tension or having muscle twitches.  Trembling or feeling shaky.  Being easily startled.  Feeling like your heart is pounding or racing.  Feeling out of breath or like you cannot take a deep breath.  Having trouble falling asleep or staying asleep.  Sweating.  Nausea, diarrhea, or irritable bowel syndrome (IBS).  Headaches.  Trouble  concentrating or remembering facts.  Restlessness.  Irritability. How is this diagnosed? Your health care provider can diagnose GAD based on your symptoms and medical history. You will also have a physical exam. The health care provider will ask specific questions about your symptoms, including how severe they are, when they started, and if they come and go. Your health care provider may ask you about your use of alcohol or drugs, including prescription medicines. Your health care provider may refer you to a mental health specialist for further evaluation. Your health care provider will do a thorough examination and may perform additional tests to rule out other possible causes of your symptoms. To be diagnosed with GAD, a person must have anxiety that:  Is out of his or her control.  Affects several different aspects of his or her life, such as work and relationships.  Causes distress that makes him or her unable to take part in normal activities.  Includes at least three physical symptoms of  GAD, such as restlessness, fatigue, trouble concentrating, irritability, muscle tension, or sleep problems. Before your health care provider can confirm a diagnosis of GAD, these symptoms must be present more days than they are not, and they must last for six months or longer. How is this treated? The following therapies are usually used to treat GAD:  Medicine. Antidepressant medicine is usually prescribed for long-term daily control. Antianxiety medicines may be added in severe cases, especially when panic attacks occur.  Talk therapy (psychotherapy). Certain types of talk therapy can be helpful in treating GAD by providing support, education, and guidance. Options include: ? Cognitive behavioral therapy (CBT). People learn coping skills and techniques to ease their anxiety. They learn to identify unrealistic or negative thoughts and behaviors and to replace them with positive ones. ? Acceptance and  commitment therapy (ACT). This treatment teaches people how to be mindful as a way to cope with unwanted thoughts and feelings. ? Biofeedback. This process trains you to manage your body's response (physiological response) through breathing techniques and relaxation methods. You will work with a therapist while machines are used to monitor your physical symptoms.  Stress management techniques. These include yoga, meditation, and exercise. A mental health specialist can help determine which treatment is best for you. Some people see improvement with one type of therapy. However, other people require a combination of therapies. Follow these instructions at home:  Take over-the-counter and prescription medicines only as told by your health care provider.  Try to maintain a normal routine.  Try to anticipate stressful situations and allow extra time to manage them.  Practice any stress management or self-calming techniques as taught by your health care provider.  Do not punish yourself for setbacks or for not making progress.  Try to recognize your accomplishments, even if they are small.  Keep all follow-up visits as told by your health care provider. This is important. Contact a health care provider if:  Your symptoms do not get better.  Your symptoms get worse.  You have signs of depression, such as: ? A persistently sad, cranky, or irritable mood. ? Loss of enjoyment in activities that used to bring you joy. ? Change in weight or eating. ? Changes in sleeping habits. ? Avoiding friends or family members. ? Loss of energy for normal tasks. ? Feelings of guilt or worthlessness. Get help right away if:  You have serious thoughts about hurting yourself or others. If you ever feel like you may hurt yourself or others, or have thoughts about taking your own life, get help right away. You can go to your nearest emergency department or call:  Your local emergency services (911 in the  [place]  A suicide crisis helpline, such as the [place] at 631 071 5133. This is open 24 hours a day. Summary  Generalized anxiety disorder (GAD) is a mental health disorder that involves worry that is not triggered by a specific event.  People with GAD often worry excessively about many things in their lives, such as their health and family.  GAD may cause physical symptoms such as restlessness, trouble concentrating, sleep problems, frequent sweating, nausea, diarrhea, headaches, and trembling or muscle twitching.  A mental health specialist can help determine which treatment is best for you. Some people see improvement with one type of therapy. However, other people require a combination of therapies. This information is not intended to replace advice given to you by your health care provider. Make sure you discuss any questions you  have with your health care provider. Document Released: 07/26/2012 Document Revised: 03/13/2017 Document Reviewed: 02/19/2016 Elsevier Patient Education  2020 [employer].

## 2019-01-27 NOTE — Progress Notes (Signed)
Established Patient Office Visit  Subjective:  Patient ID: Stephanie Simon, female    DOB: [birth year]  Age: 23 y.o. MRN: [id number]  CC:  Chief Complaint  Patient presents with  . Anxiety    HPI Stephanie Simon, a 23 year old female presents complaining of increased anxiety over the past several weeks.  Her symptoms include racing thoughts, difficulty concentrating, increased anger, and irritability.  She states that she discuss this before with the previous primary care provider.  Patient has not been on medication for this problem.  She currently denies suicidal or homicidal ideations.  She does not have a family history that significant for anxiety, depression, or panic attacks.  No organic causes contributing to anxiety identified.  Patient states that her triggers are typically her 9-year-old and her family.  Past Medical History:  Diagnosis Date  . Cardiac murmur 03/28/2013   ECHO 05/13/2013 NORMAL ECHO.  Benign Flow murmur. Heard best in the aortic space and LEFT carotid.  Increases with increased pressure.     Past Surgical History:  Procedure Laterality Date  . WISDOM TOOTH EXTRACTION  06/2015    Family History  Problem Relation Age of Onset  . Cancer Paternal Aunt   . Anemia Mother   . Diabetes Father   . Arthritis Father        gout  . Anemia Father   . Hypertension Father   . Heart disease Maternal Grandfather     Social History   Socioeconomic History  . Marital status: Single    Spouse name: n/a  . Number of children: 0  . Years of education: Not on file  . Highest education level: Not on file  Occupational History  . Occupation: Consulting civil engineer    Comment: [employer]  Social Needs  . Financial resource strain: Not on file  . Food insecurity    Worry: Not on file    Inability: Not on file  . Transportation needs    Medical: Not on file    Non-medical: Not on file  Tobacco Use  . Smoking status: Never Smoker  . Smokeless tobacco: Never Used  Substance  and Sexual Activity  . Alcohol use: No  . Drug use: No  . Sexual activity: Yes    Partners: Male    Birth control/protection: None, Injection  Lifestyle  . Physical activity    Days per week: Not on file    Minutes per session: Not on file  . Stress: Not on file  Relationships  . Social Musician on phone: Not on file    Gets together: Not on file    Attends religious service: Not on file    Active member of club or organization: Not on file    Attends meetings of clubs or organizations: Not on file    Relationship status: Not on file  . Intimate partner violence    Fear of current or ex partner: Not on file    Emotionally abused: Not on file    Physically abused: Not on file    Forced sexual activity: Not on file  Other Topics Concern  . Not on file  Social History Narrative   Lives with her parents.  She has 7 half-sisters and one half-brother.    Outpatient Medications Prior to Visit  Medication Sig Dispense Refill  . medroxyPROGESTERone (DEPO-PROVERA) 150 MG/ML injection Inject 150 mg into the muscle every 3 (three) months.     No facility-administered medications prior  to visit.     No Known Allergies  ROS Review of Systems  HENT: Negative.   Respiratory: Negative.   Cardiovascular: Negative.   Gastrointestinal: Negative.   Endocrine: Negative for polydipsia, polyphagia and polyuria.  Genitourinary: Negative.   Musculoskeletal: Negative.   Psychiatric/Behavioral: Positive for decreased concentration. Negative for agitation, behavioral problems, hallucinations, self-injury, sleep disturbance and suicidal ideas. The patient is nervous/anxious.       Objective:    Physical Exam  Constitutional: She is oriented to person, place, and time. She appears well-developed and well-nourished.  Eyes: Pupils are equal, round, and reactive to light.  Cardiovascular: Normal rate and regular rhythm.  Pulmonary/Chest: Effort normal and breath sounds normal.   Abdominal: Soft. Bowel sounds are normal.  Musculoskeletal: Normal range of motion.  Neurological: She is alert and oriented to person, place, and time.  Skin: Skin is warm and dry.  Psychiatric: Her behavior is normal. Judgment and thought content normal. Her mood appears anxious.    BP 111/60 (BP Location: Left Arm, Patient Position: Sitting, Cuff Size: Normal)   Pulse (!) 56   Temp 98.3 F (36.8 C) (Oral)   Resp 16   Ht 5\' 2"  (1.575 m)   Wt 166 lb (75.3 kg)   LMP 01/03/2019   SpO2 100%   BMI 30.36 kg/m  Wt Readings from Last 3 Encounters:  01/25/19 166 lb (75.3 kg)  10/08/18 164 lb (74.4 kg)  09/14/18 165 lb (74.8 kg)     Health Maintenance Due  Topic Date Due  . CHLAMYDIA SCREENING  02/04/2018    There are no preventive care reminders to display for this patient.  Lab Results  Component Value Date   TSH 1.520 10/08/2018   Lab Results  Component Value Date   WBC 8.5 10/08/2018   HGB 11.3 10/08/2018   HCT 37.5 10/08/2018   MCV 74 (L) 10/08/2018   PLT 315 10/08/2018   Lab Results  Component Value Date   NA 142 10/08/2018   K 4.6 10/08/2018   CO2 20 10/08/2018   GLUCOSE 81 10/08/2018   BUN 13 10/08/2018   CREATININE 0.83 10/08/2018   BILITOT 0.3 10/08/2018   ALKPHOS 61 10/08/2018   AST 17 10/08/2018   ALT 14 10/08/2018   PROT 7.1 10/08/2018   ALBUMIN 4.6 10/08/2018   CALCIUM 9.5 10/08/2018   No results found for: CHOL No results found for: HDL No results found for: LDLCALC No results found for: TRIG No results found for: CHOLHDL No results found for: HGBA1C    Assessment & Plan:   Problem List Items Addressed This Visit    None    Visit Diagnoses    Generalized anxiety disorder    -  Primary   Relevant Medications   escitalopram (LEXAPRO) 10 MG tablet   hydrOXYzine (ATARAX/VISTARIL) 10 MG tablet      Meds ordered this encounter  Medications  . escitalopram (LEXAPRO) 10 MG tablet    Sig: Take 1 tablet (10 mg total) by mouth at  bedtime.    Dispense:  30 tablet    Refill:  1    Order Specific Question:   Supervising Provider    Answer:   Tresa Garter [id number]  . hydrOXYzine (ATARAX/VISTARIL) 10 MG tablet    Sig: Take 1 tablet (10 mg total) by mouth 3 (three) times daily as needed.    Dispense:  30 tablet    Refill:  0    Order Specific Question:   Supervising  Provider    Answer:   Quentin AngstJEGEDE, OLUGBEMIGA E [[id number]]   1. Generalized anxiety disorder GAD 7 : Generalized Anxiety Score 01/25/2019 10/08/2018  Nervous, Anxious, on Edge 1 2  Control/stop worrying 1 1  Worry too much - different things 0 0  Trouble relaxing 0 0  Restless 0 0  Easily annoyed or irritable 3 3  Afraid - awful might happen 1 0  Total GAD 7 Score 6 6  Anxiety Difficulty Somewhat difficult Not difficult at all   Patient and I discussed anxiety at length. Will start a trial of Lexapro at bedtime.  Patient is not interested in counseling at this time. She says that she feels comfortable discussing anxiety with her PCP at this time. Will follow up in 1 month.  - escitalopram (LEXAPRO) 10 MG tablet; Take 1 tablet (10 mg total) by mouth at bedtime.  Dispense: 30 tablet; Refill: 1 - hydrOXYzine (ATARAX/VISTARIL) 10 MG tablet; Take 1 tablet (10 mg total) by mouth 3 (three) times daily as needed.  Dispense: 30 tablet; Refill: 0   Follow-up:   Follow up in 1 month   Manaal Mandala Rennis PettyMoore Evelise Reine  APRN, MSN, FNP-C Patient Care [facility] [address]  [place], [place] [id number] [phone]

## 2019-03-15 ENCOUNTER — Ambulatory Visit: Payer: Medicaid Other | Admitting: Family Medicine

## 2019-04-15 NOTE — L&D Delivery Note (Signed)
Patient: Natalya Domzalski MRN: [id number]  GBS status: Negative, IAP not administered  Patient is a 24 y.o. now G2P2002 s/p NSVD at [redacted]w[redacted]d, who was admitted for SOL. SROM 3h 38m prior to delivery with clear-moderate meconium fluid.    Delivery Note At 8:31 AM a viable female was delivered via Vaginal, Spontaneous (Presentation: Right Occiput Anterior).  APGAR: 8, 9; weight see delivery summary.   Placenta status: Spontaneous, Intact. Cord: 3 vessels with the following complications: Long.  Cord pH: gases not collected.  Head delivered ROA. No nuchal cord present. Shoulder and body delivered in usual fashion. Infant with spontaneous cry, placed on mother's abdomen, dried and bulb suctioned. Cord clamped x 2 after 1-minute delay, and cut by family member. Cord blood drawn. Placenta delivered spontaneously with gentle cord traction. Fundus firm with massage and Pitocin. Perineum inspected and found to have 1st  laceration, which was found to be hemostatic and did not require repaired.  Anesthesia: Spinal Episiotomy: None Lacerations: 1 st degree Left perineal Suture Repair: N/a Est. Blood Loss (mL): 400  Mom to postpartum.  Baby to Couplet care / Skin to Skin.  Alannis Hsia Autry-Lott 03/11/2020, 9:00 AM

## 2019-05-12 ENCOUNTER — Ambulatory Visit: Payer: Medicaid Other | Attending: Internal Medicine

## 2019-05-12 DIAGNOSIS — Z20822 Contact with and (suspected) exposure to covid-19: Secondary | ICD-10-CM

## 2019-05-13 LAB — NOVEL CORONAVIRUS, NAA: SARS-CoV-2, NAA: DETECTED — AB

## 2019-08-15 ENCOUNTER — Other Ambulatory Visit (HOSPITAL_COMMUNITY)
Admission: RE | Admit: 2019-08-15 | Discharge: 2019-08-15 | Disposition: A | Discharge status: Home / Self Care | Payer: Medicaid Other | Source: Ambulatory Visit | Attending: Obstetrics and Gynecology | Admitting: Obstetrics and Gynecology

## 2019-08-15 ENCOUNTER — Encounter: Payer: Self-pay | Admitting: Obstetrics and Gynecology

## 2019-08-15 ENCOUNTER — Ambulatory Visit (INDEPENDENT_AMBULATORY_CARE_PROVIDER_SITE_OTHER): Payer: Medicaid Other | Admitting: Obstetrics and Gynecology

## 2019-08-15 ENCOUNTER — Other Ambulatory Visit: Payer: Self-pay

## 2019-08-15 VITALS — BP 121/73 | HR 80 | Wt 194.0 lb

## 2019-08-15 DIAGNOSIS — F419 Anxiety disorder, unspecified: Secondary | ICD-10-CM

## 2019-08-15 DIAGNOSIS — O99341 Other mental disorders complicating pregnancy, first trimester: Secondary | ICD-10-CM

## 2019-08-15 DIAGNOSIS — E669 Obesity, unspecified: Secondary | ICD-10-CM | POA: Diagnosis not present

## 2019-08-15 DIAGNOSIS — O99210 Obesity complicating pregnancy, unspecified trimester: Secondary | ICD-10-CM

## 2019-08-15 DIAGNOSIS — O99211 Obesity complicating pregnancy, first trimester: Secondary | ICD-10-CM | POA: Diagnosis not present

## 2019-08-15 DIAGNOSIS — F329 Major depressive disorder, single episode, unspecified: Secondary | ICD-10-CM

## 2019-08-15 DIAGNOSIS — Z3480 Encounter for supervision of other normal pregnancy, unspecified trimester: Secondary | ICD-10-CM | POA: Insufficient documentation

## 2019-08-15 DIAGNOSIS — Z3A10 10 weeks gestation of pregnancy: Secondary | ICD-10-CM

## 2019-08-15 MED ORDER — DOXYLAMINE-PYRIDOXINE 10-10 MG PO TBEC: 2.0000 | DELAYED_RELEASE_TABLET | Freq: Every day | ORAL | 5 refills | Status: DC

## 2019-08-15 NOTE — Progress Notes (Signed)
New OB Note  08/15/2019   Clinic: [facility]  Chief Complaint: NOB  Transfer of Care Patient: no  History of Present Illness: Ms. Dematteo is a 24 y.o. G2P1001 @ 10/5 weeks ([place] 11/24, based on Patient's last menstrual period was 06/01/2019 (exact date).=in office u/s today  Preg complicated by has Abnormal uterine bleeding (AUB); Menorrhagia with regular cycle; Cervical ectropion; and Supervision of other normal pregnancy, antepartum on their problem list.    ROS: A 12-point review of systems was performed and negative, except as stated in the above HPI.  OBGYN History: As per HPI. OB History  Gravida Para Term Preterm AB Living  2 1 1     1   SAB TAB Ectopic Multiple Live Births        0 1    # Outcome Date GA Lbr Len/2nd Weight Sex Delivery Anes PTL Lv  2 Current           1 Term 03/10/17 [redacted]w[redacted]d 20:45 / 02:21 9 lb 3.8 oz (4.19 kg) F Vag-Spont EPI  LIV    Any issues with any prior pregnancies: no Prior children are healthy, doing well, and without any problems or issues: yes History of pap smears: Yes. Last pap smear 2020 and results were negative   Past Medical History: Past Medical History:  Diagnosis Date  . Cardiac murmur 03/28/2013   ECHO 05/13/2013 NORMAL ECHO.  Benign Flow murmur. Heard best in the aortic space and LEFT carotid.  Increases with increased pressure.     Past Surgical History: Past Surgical History:  Procedure Laterality Date  . WISDOM TOOTH EXTRACTION  06/2015    Family History:  Family History  Problem Relation Age of Onset  . Cancer Paternal Aunt   . Anemia Mother   . Diabetes Father   . Arthritis Father        gout  . Anemia Father   . Hypertension Father   . Heart disease Maternal Grandfather    Social History:  Social History   Socioeconomic History  . Marital status: Single    Spouse name: n/a  . Number of children: 0  . Years of education: Not on file  . Highest education level: Not on file   Occupational History  . Occupation: student    Comment: [employer]  Tobacco Use  . Smoking status: Never Smoker  . Smokeless tobacco: Never Used  Substance and Sexual Activity  . Alcohol use: No  . Drug use: No  . Sexual activity: Yes    Partners: Male    Birth control/protection: None  Other Topics Concern  . Not on file  Social History Narrative   Lives with her parents.  She has 7 half-sisters and one half-brother.   Social Determinants of Health   Financial Resource Strain:   . Difficulty of Paying Living Expenses:   Food Insecurity:   . Worried About Charity fundraiser in the Last Year:   . Arboriculturist in the Last Year:   Transportation Needs:   . Film/video editor (Medical):   Marland Kitchen Lack of Transportation (Non-Medical):   Physical Activity:   . Days of Exercise per Week:   . Minutes of Exercise per Session:   Stress:   . Feeling of Stress :   Social Connections:   . Frequency of Communication with Friends and Family:   . Frequency of Social Gatherings with Friends and Family:   . Attends Religious Services:   .  Active Member of Clubs or Organizations:   . Attends Banker Meetings:   Marland Kitchen Marital Status:   Intimate Partner Violence:   . Fear of Current or Ex-Partner:   . Emotionally Abused:   Marland Kitchen Physically Abused:   . Sexually Abused:     Allergy: No Known Allergies  Health Maintenance:  Mammogram Up to Date: not applicable  Current Outpatient Medications: Prenatal vitamin  Physical Exam:   BP 121/73   Pulse 80   Wt 194 lb (88 kg)   LMP 06/01/2019 (Exact Date)   BMI 35.48 kg/m  Body mass index is 35.48 kg/m. Contractions: Not present Vag. Bleeding: None. Fundal height: not applicable FHTs: 170s  General appearance: Well nourished, well developed female in no acute distress.  Cardiovascular: S1, S2 normal, no murmur, rub or gallop, regular rate and rhythm Respiratory:  Clear to auscultation bilateral. Normal  respiratory effort Abdomen: nttp Breasts: pt denies any breast s/s Neuro/Psych:  Normal mood and affect.  Skin:  Warm and dry.   Laboratory: none  Imaging:  11/0 based on CRL with FHR 170s on TAUS today, SLIUP.   Assessment: pt doing well  Plan: 1. Supervision of other normal pregnancy, antepartum diclegis for morning sickness Declines genetics Order anatomy u/s nv Weight goals d/w her - Obstetric Panel, Including HIV - Culture, OB Urine - GC/Chlamydia probe amp ([place])not at [facility] - Hepatitis C antibody - Protein / creatinine ratio, urine - Hemoglobin A1c  2. Anxiety/depression Pt off lexapro. D/w her okay to take in pregnancy  Problem list reviewed and updated.  Follow up in 4 weeks.  >50% of 20 min visit spent on counseling and coordination of care.     Cornelia Copa MD Attending Center for [facility])

## 2019-08-15 NOTE — Progress Notes (Signed)
DATING AND VIABILITY SONOGRAM   Stephanie Simon is a 24 y.o. year old G2P1001 with LMP Patient's last menstrual period was 06/01/2019 (exact date). which would correlate to  [redacted]w[redacted]d weeks gestation.  She has regular menstrual cycles.   She is here today for a confirmatory initial sonogram.    GESTATION: SINGLETON yes     FETAL ACTIVITY:          Heart rate    173          The fetus is active.  GESTATIONAL AGE AND  BIOMETRICS:  Gestational criteria: Estimated Date of Delivery: 03/07/20 by LMP now at [redacted]w[redacted]d  Previous Scans:0  GESTATIONAL SAC            mm          weeks  CROWN RUMP LENGTH           4.05 mm         11.0 weeks                                                   AVERAGE EGA(BY THIS SCAN):  11.0 weeks  WORKING EDD( LMP ):  03/07/2020     TECHNICIAN COMMENTS:   Patient informed that the ultrasound is considered a limited obstetric ultrasound and is not intended to be a complete ultrasound exam. Patient also informed that the ultrasound is not being completed with the intent of assessing for fetal or placental anomalies or any pelvic abnormalities. Explained that the purpose of today's ultrasound is to assess for fetal heart rate. Patient acknowledges the purpose of the exam and the limitations of the study.      A copy of this report including all images has been saved and backed up to a second source for retrieval if needed. All measures and details of the anatomical scan, placentation, fluid volume and pelvic anatomy are contained in that report.  Scheryl Marten 08/15/2019 1:51 PM

## 2019-08-15 NOTE — Addendum Note (Signed)
Addended by: Scheryl Marten on: 08/15/2019 02:10 PM   Modules accepted: Orders

## 2019-08-16 LAB — OBSTETRIC PANEL, INCLUDING HIV
Antibody Screen: NEGATIVE
Basophils Absolute: 0 10*3/uL (ref 0.0–0.2)
Basos: 0 %
EOS (ABSOLUTE): 0.1 10*3/uL (ref 0.0–0.4)
Eos: 1 %
HIV Screen 4th Generation wRfx: NONREACTIVE
Hematocrit: 39.2 % (ref 34.0–46.6)
Hemoglobin: 12.4 g/dL (ref 11.1–15.9)
Hepatitis B Surface Ag: NEGATIVE
Immature Grans (Abs): 0 10*3/uL (ref 0.0–0.1)
Immature Granulocytes: 0 %
Lymphocytes Absolute: 2.6 10*3/uL (ref 0.7–3.1)
Lymphs: 26 %
MCH: 24.9 pg — ABNORMAL LOW (ref 26.6–33.0)
MCHC: 31.6 g/dL (ref 31.5–35.7)
MCV: 79 fL (ref 79–97)
Monocytes Absolute: 0.8 10*3/uL (ref 0.1–0.9)
Monocytes: 8 %
Neutrophils Absolute: 6.6 10*3/uL (ref 1.4–7.0)
Neutrophils: 65 %
Platelets: 343 10*3/uL (ref 150–450)
RBC: 4.97 x10E6/uL (ref 3.77–5.28)
RDW: 16.7 % — ABNORMAL HIGH (ref 11.7–15.4)
RPR Ser Ql: NONREACTIVE
Rh Factor: POSITIVE
Rubella Antibodies, IGG: 1.97 index (ref >0.99)
WBC: 10.2 10*3/uL (ref 3.4–10.8)

## 2019-08-16 LAB — CULTURE, OB URINE

## 2019-08-16 LAB — PROTEIN / CREATININE RATIO, URINE
Creatinine, Urine: 96.6 mg/dL
Protein, Ur: 8.1 mg/dL
Protein/Creat Ratio: 84 mg/g creat (ref 0–200)

## 2019-08-16 LAB — HEMOGLOBIN A1C
Est. average glucose Bld gHb Est-mCnc: 103 mg/dL
Hgb A1c MFr Bld: 5.2 % (ref 4.8–5.6)

## 2019-08-16 LAB — URINE CULTURE, OB REFLEX

## 2019-08-16 LAB — HEPATITIS C ANTIBODY: Hep C Virus Ab: <0.1 s/co ratio (ref 0.0–0.9)

## 2019-08-17 LAB — GC/CHLAMYDIA PROBE AMP (~~LOC~~) NOT AT ARMC
Chlamydia: NEGATIVE
Comment: NEGATIVE
Comment: NORMAL
Neisseria Gonorrhea: NEGATIVE

## 2019-08-19 ENCOUNTER — Other Ambulatory Visit: Payer: Self-pay | Admitting: *Deleted

## 2019-08-19 MED ORDER — PROMETHAZINE HCL 25 MG PO TABS: 25.0000 mg | ORAL_TABLET | Freq: Four times a day (QID) | ORAL | 1 refills | Status: DC | PRN

## 2019-08-19 NOTE — Progress Notes (Signed)
Pt called stating she is super nauseated and is afraid to drink anything. Informed that she had a RX for Diclegis and that I will send in phenergan to help as well.

## 2019-09-14 ENCOUNTER — Telehealth (INDEPENDENT_AMBULATORY_CARE_PROVIDER_SITE_OTHER): Payer: Medicaid Other | Admitting: Family Medicine

## 2019-09-14 ENCOUNTER — Encounter: Payer: Self-pay | Admitting: Family Medicine

## 2019-09-14 ENCOUNTER — Other Ambulatory Visit: Payer: Self-pay

## 2019-09-14 VITALS — Wt 195.0 lb

## 2019-09-14 DIAGNOSIS — Z3A15 15 weeks gestation of pregnancy: Secondary | ICD-10-CM

## 2019-09-14 DIAGNOSIS — E669 Obesity, unspecified: Secondary | ICD-10-CM

## 2019-09-14 DIAGNOSIS — O99212 Obesity complicating pregnancy, second trimester: Secondary | ICD-10-CM

## 2019-09-14 DIAGNOSIS — O99210 Obesity complicating pregnancy, unspecified trimester: Secondary | ICD-10-CM

## 2019-09-14 DIAGNOSIS — Z3480 Encounter for supervision of other normal pregnancy, unspecified trimester: Secondary | ICD-10-CM

## 2019-09-14 NOTE — Progress Notes (Signed)
I connected with  Stephanie Simon on 09/14/19 at  1:15 PM EDT by telephone and verified that I am speaking with the correct person using two identifiers.   I discussed the limitations, risks, security and privacy concerns of performing an evaluation and management service by telephone and the availability of in person appointments. I also discussed with the patient that there may be a patient responsible charge related to this service. The patient expressed understanding and agreed to proceed.  Stephanie Simon Emeline Darling, CMA 09/14/2019  1:12 PM

## 2019-09-14 NOTE — Progress Notes (Signed)
I connected with@ on 09/14/19 at  1:15 PM EDT by: MyChart video and verified that I am speaking with the correct person using two identifiers.  Patient is located at  home and provider is located at [facility].     The purpose of this virtual visit is to provide medical care while limiting exposure to the novel coronavirus. I discussed the limitations, risks, security and privacy concerns of performing an evaluation and management service by MyChart video and the availability of in person appointments. I also discussed with the patient that there may be a patient responsible charge related to this service. By engaging in this virtual visit, you consent to the provision of healthcare.  Additionally, you authorize for your insurance to be billed for the services provided during this visit.  The patient expressed understanding and agreed to proceed.  The following staff members participated in the virtual visit:  Matthew Saras    PRENATAL VISIT NOTE  Subjective:  Stephanie Simon is a 24 y.o. G2P1001 at [redacted]w[redacted]d  for phone visit for ongoing prenatal care.  She is currently monitored for the following issues for this low-risk pregnancy and has Abnormal uterine bleeding (AUB); Menorrhagia with regular cycle; Cervical ectropion; Supervision of other normal pregnancy, antepartum; BMI 30s; Obesity in pregnancy; and Anxiety and depression on their problem list.  Patient reports no complaints.  Contractions: Not present.  .  Movement: Present. Denies leaking of fluid.   The following portions of the patient's history were reviewed and updated as appropriate: allergies, current medications, past family history, past medical history, past social history, past surgical history and problem list.   Objective:   Vitals:   09/14/19 1309  Weight: 195 lb (88.5 kg)   Self-Obtained  Fetal Status:     Movement: Present     Assessment and Plan:  Pregnancy: G2P1001 at [redacted]w[redacted]d  1. Supervision of other normal pregnancy,  antepartum Up dated OB box - [place] scheduled for 6/28 at 1:30- told patient over through MyChart Video visit.  - [place] MFM OB COMP + 14 WK; Future  2. Obesity in pregnancy TWG= 15 lb (6.804 kg) which is above goal at this poin in pregnancy.   Reports over gaining in last pregnancy and then not returning prepregnancy weight Would like to meet with nutrition - referral placed today She reports she does not currently exercise and eats "really badly" Discussed tracking food to help with counseling and to determine if there are better food choices/alternatives Reviewed safety of exercise- recommended starting with 30 minutes of walking the majority of the days of the week.  Referrral to Nutrition and Diabetes  Preterm labor symptoms and general obstetric precautions including but not limited to vaginal bleeding, contractions, leaking of fluid and fetal movement were reviewed in detail with the patient.  Return in about 4 weeks (around 10/12/2019) for Routine prenatal care, Telehealth/Virtual health OB Visit.  Future Appointments  Date Time Provider Department Center  10/10/2019  1:30 PM [facility]URSE [facility]  10/10/2019  1:30 PM [facility]  10/12/2019  1:45 PM Federico Flake, MD [facility]     Time spent on virtual visit:  minutes  Federico Flake, MD

## 2019-09-14 NOTE — Patient Instructions (Addendum)
  Address for [place]: [address] [place]    Safe Medications in Pregnancy   Acne: Benzoyl Peroxide Salicylic Acid  Backache/Headache: Tylenol: 2 regular strength every 4 hours OR              2 Extra strength every 6 hours  Colds/Coughs/Allergies: Benadryl (alcohol free) 25 mg every 6 hours as needed Breath right strips Claritin Cepacol throat lozenges Chloraseptic throat spray Cold-Eeze- up to three times per day Cough drops, alcohol free Flonase (by prescription only) Guaifenesin Mucinex Robitussin DM (plain only, alcohol free) Saline nasal spray/drops Sudafed (pseudoephedrine) & Actifed ** use only after [redacted] weeks gestation and if you do not have high blood pressure Tylenol Vicks Vaporub Zinc lozenges Zyrtec   Constipation: Colace Ducolax suppositories Fleet enema Glycerin suppositories Metamucil Milk of magnesia Miralax Senokot Smooth move tea  Diarrhea: Kaopectate Imodium A-D  *NO pepto Bismol  Hemorrhoids: Anusol Anusol HC Preparation H Tucks  Indigestion: Tums Maalox Mylanta Zantac  Pepcid  Insomnia: Benadryl (alcohol free) 25mg  every 6 hours as needed Tylenol PM Unisom, no Gelcaps  Leg Cramps: Tums MagGel  Nausea/Vomiting:  Bonine Dramamine Emetrol Ginger extract Sea bands Meclizine  Nausea medication to take during pregnancy:  Unisom (doxylamine succinate 25 mg tablets) Take one tablet daily at bedtime. If symptoms are not adequately controlled, the dose can be increased to a maximum recommended dose of two tablets daily (1/2 tablet in the morning, 1/2 tablet mid-afternoon and one at bedtime). Vitamin B6 100mg  tablets. Take one tablet twice a day (up to 200 mg per day).  Skin Rashes: Aveeno products Benadryl cream or 25mg  every 6 hours as needed Calamine Lotion 1% cortisone cream  Yeast infection: Gyne-lotrimin 7 Monistat 7   **If taking multiple medications, please check labels to avoid duplicating the same  active ingredients **take medication as directed on the label ** Do not exceed 4000 mg of tylenol in 24 hours **Do not take medications that contain aspirin or ibuprofen

## 2019-10-10 ENCOUNTER — Other Ambulatory Visit: Payer: Self-pay | Admitting: *Deleted

## 2019-10-10 ENCOUNTER — Ambulatory Visit: Payer: Medicaid Other | Attending: Family Medicine

## 2019-10-10 ENCOUNTER — Encounter: Payer: Self-pay | Admitting: Family Medicine

## 2019-10-10 ENCOUNTER — Ambulatory Visit: Payer: Medicaid Other | Admitting: *Deleted

## 2019-10-10 ENCOUNTER — Other Ambulatory Visit: Payer: Self-pay

## 2019-10-10 DIAGNOSIS — O35EXX0 Maternal care for other (suspected) fetal abnormality and damage, fetal genitourinary anomalies, not applicable or unspecified: Secondary | ICD-10-CM | POA: Insufficient documentation

## 2019-10-10 DIAGNOSIS — Z3480 Encounter for supervision of other normal pregnancy, unspecified trimester: Secondary | ICD-10-CM | POA: Insufficient documentation

## 2019-10-10 DIAGNOSIS — O99212 Obesity complicating pregnancy, second trimester: Secondary | ICD-10-CM

## 2019-10-10 DIAGNOSIS — O322XX0 Maternal care for transverse and oblique lie, not applicable or unspecified: Secondary | ICD-10-CM

## 2019-10-10 DIAGNOSIS — O99210 Obesity complicating pregnancy, unspecified trimester: Secondary | ICD-10-CM | POA: Insufficient documentation

## 2019-10-10 DIAGNOSIS — O358XX0 Maternal care for other (suspected) fetal abnormality and damage, not applicable or unspecified: Secondary | ICD-10-CM | POA: Diagnosis not present

## 2019-10-10 DIAGNOSIS — Z363 Encounter for antenatal screening for malformations: Secondary | ICD-10-CM

## 2019-10-10 DIAGNOSIS — Z362 Encounter for other antenatal screening follow-up: Secondary | ICD-10-CM

## 2019-10-10 DIAGNOSIS — Z3A18 18 weeks gestation of pregnancy: Secondary | ICD-10-CM | POA: Diagnosis not present

## 2019-10-12 ENCOUNTER — Other Ambulatory Visit: Payer: Self-pay

## 2019-10-12 ENCOUNTER — Ambulatory Visit (INDEPENDENT_AMBULATORY_CARE_PROVIDER_SITE_OTHER): Payer: Medicaid Other | Admitting: Family Medicine

## 2019-10-12 VITALS — BP 113/72 | HR 78 | Wt 201.0 lb

## 2019-10-12 DIAGNOSIS — O99210 Obesity complicating pregnancy, unspecified trimester: Secondary | ICD-10-CM

## 2019-10-12 DIAGNOSIS — Z3A19 19 weeks gestation of pregnancy: Secondary | ICD-10-CM

## 2019-10-12 DIAGNOSIS — E669 Obesity, unspecified: Secondary | ICD-10-CM

## 2019-10-12 DIAGNOSIS — O358XX0 Maternal care for other (suspected) fetal abnormality and damage, not applicable or unspecified: Secondary | ICD-10-CM

## 2019-10-12 DIAGNOSIS — Z3480 Encounter for supervision of other normal pregnancy, unspecified trimester: Secondary | ICD-10-CM

## 2019-10-12 DIAGNOSIS — O35EXX0 Maternal care for other (suspected) fetal abnormality and damage, fetal genitourinary anomalies, not applicable or unspecified: Secondary | ICD-10-CM

## 2019-10-12 DIAGNOSIS — O99212 Obesity complicating pregnancy, second trimester: Secondary | ICD-10-CM

## 2019-10-12 NOTE — Progress Notes (Signed)
   PRENATAL VISIT NOTE  Subjective:  Stephanie Simon is a 24 y.o. G2P1001 at [redacted]w[redacted]d being seen today for ongoing prenatal care.  She is currently monitored for the following issues for this high-risk pregnancy and has Menorrhagia with regular cycle; Supervision of other normal pregnancy, antepartum; BMI 30s; Obesity in pregnancy; Anxiety and depression; and Fetal renal anomaly, single gestation on their problem list.  Patient reports no complaints.  Contractions: Not present. Vag. Bleeding: None.  Movement: Present. Denies leaking of fluid.   The following portions of the patient's history were reviewed and updated as appropriate: allergies, current medications, past family history, past medical history, past social history, past surgical history and problem list.   Objective:   Vitals:   10/12/19 1401  BP: 113/72  Pulse: 78  Weight: 201 lb (91.2 kg)    Fetal Status: Fetal Heart Rate (bpm): 150 Fundal Height: 19 cm Movement: Present     General:  Alert, oriented and cooperative. Patient is in no acute distress.  Skin: Skin is warm and dry. No rash noted.   Cardiovascular: Normal heart rate noted  Respiratory: Normal respiratory effort, no problems with respiration noted  Abdomen: Soft, gravid, appropriate for gestational age.  Pain/Pressure: Absent     Pelvic: Cervical exam deferred        Extremities: Normal range of motion.  Edema: None  Mental Status: Normal mood and affect. Normal behavior. Normal judgment and thought content.   Assessment and Plan:  Pregnancy: G2P1001 at [redacted]w[redacted]d 1. [redacted] weeks gestation of pregnancy  2. Fetal renal anomaly, single gestation Left pelvic kidney NIPT drawn today  3. Supervision of other normal pregnancy, antepartum Up to date Reviewed [place] results Discussed NIPT today and patient desires  4. BMI 30s  5. Obesity in pregnancy TWG=21 lb (9.526 kg) - which is above goal. Consider nutrition consult if continued excessive weight gain  Preterm labor  symptoms and general obstetric precautions including but not limited to vaginal bleeding, contractions, leaking of fluid and fetal movement were reviewed in detail with the patient. Please refer to After Visit Summary for other counseling recommendations.   Return in about 4 weeks (around 11/09/2019) for Routine prenatal care, in person.  Future Appointments  Date Time Provider Department Center  10/24/2019  2:00 PM Vernell Leep, RD [facility]  11/07/2019 12:30 PM [facility] NURSE [facility]  11/07/2019 12:45 PM [facility] US6 [facility]    Federico Flake, MD

## 2019-10-20 ENCOUNTER — Telehealth: Payer: Self-pay | Admitting: Radiology

## 2019-10-20 ENCOUNTER — Encounter: Payer: Self-pay | Admitting: Radiology

## 2019-10-20 NOTE — Telephone Encounter (Signed)
Called patient to inform of Panorama results but was unable to leave voicemail. Sent mychart informing patient results have been scanned

## 2019-10-24 ENCOUNTER — Other Ambulatory Visit: Payer: Self-pay

## 2019-10-24 ENCOUNTER — Encounter: Payer: Medicaid Other | Attending: Family Medicine | Admitting: Dietician

## 2019-10-24 DIAGNOSIS — O99210 Obesity complicating pregnancy, unspecified trimester: Secondary | ICD-10-CM | POA: Insufficient documentation

## 2019-10-24 NOTE — Progress Notes (Signed)
Medical Nutrition Therapy   Primary concerns today: obesity/weight gain during pregnancy   Referral diagnosis: O99.210- obesity in pregnancy  Preferred learning style: no preference indicated Learning readiness: ready   NUTRITION ASSESSMENT   Anthropometrics  Weight: 201.4 lbs  Gestational Age: 22 weeks, 5 days  Lifestyle & Dietary Hx Patient states this is her second pregnancy, has a daughter who is 62 years old. States she gained weight during her first pregnancy and put on additional weight throughout the past year or so, reaching ~180 lbs. States that she has put on an additional ~20 lbs so far in her current pregnancy. States her doctor recommended not weighing more than 205 lbs by the end of current pregnancy.  States that much of her past weight gain came from eating large amounts of food all day long, and snacking on "junk" including candy and chips. States she is trying to get better at this. Typical meal pattern is 2-3 meals per day, plus maybe some snacks but not as much as before. Eats out occasionally. States she eats a lot of rice and beans and meat, including pork, chicken, beef, and shrimp. States she only drinks water but only gets in about 2 maybe 3 bottles (16 oz/each) per day. Patient asked about appropriate, healthy frozen meals she could have on hand since she is busy.   Estimated daily fluid intake: 2-3 water bottles  Supplements: One-a-Day Prenatal  Current average weekly physical activity: none  24-Hr Dietary Recall First Meal: eggs  Snack: -  Second Meal: pork  Snack: chips Third Meal: meat + salsa + rice + beans  Snack: candy Beverages: water  Estimated Energy Needs Calories: 2200 Carbohydrate: 248g Protein: 138g Fat: 73g   NUTRITION DIAGNOSIS  Overweight/obesity ([place]-3.3) related to excessive energy intake and decreased physical activity as evidenced by patient reported dietary hx and BMI of 36.8 kg/m2.    NUTRITION INTERVENTION  Nutrition  education (E-1) on the following topics:  . Nutrition during pregnancy, including dietary recommendations and food safety  . Weight recommendations during pregnancy, with the goal of maintaining weight  . The importance of consuming adequate calories, protein, fluids, etc by eating whole nutritious foods and limiting processed foods  Handouts Provided Include   Pregnancy Nutrition  Learning Style & Readiness for Change Teaching method utilized: Visual & Auditory  Demonstrated degree of understanding via: Teach Back  Barriers to learning/adherence to lifestyle change: None Identified   MONITORING & EVALUATION Dietary intake, weekly physical activity, and goals PRN.  Next Steps  Patient is to contact [facility] as needed for follow up or questions. Patient states she would like to follow up after pregnancy for weight managment.

## 2019-10-25 ENCOUNTER — Encounter: Payer: Self-pay | Admitting: Dietician

## 2019-11-07 ENCOUNTER — Other Ambulatory Visit: Payer: Self-pay

## 2019-11-07 ENCOUNTER — Ambulatory Visit: Payer: Medicaid Other | Admitting: *Deleted

## 2019-11-07 ENCOUNTER — Encounter: Payer: Self-pay | Admitting: *Deleted

## 2019-11-07 ENCOUNTER — Other Ambulatory Visit: Payer: Self-pay | Admitting: *Deleted

## 2019-11-07 ENCOUNTER — Ambulatory Visit: Payer: Medicaid Other | Attending: Obstetrics

## 2019-11-07 DIAGNOSIS — Z3A22 22 weeks gestation of pregnancy: Secondary | ICD-10-CM

## 2019-11-07 DIAGNOSIS — E669 Obesity, unspecified: Secondary | ICD-10-CM

## 2019-11-07 DIAGNOSIS — O35EXX0 Maternal care for other (suspected) fetal abnormality and damage, fetal genitourinary anomalies, not applicable or unspecified: Secondary | ICD-10-CM

## 2019-11-07 DIAGNOSIS — Z3480 Encounter for supervision of other normal pregnancy, unspecified trimester: Secondary | ICD-10-CM | POA: Diagnosis present

## 2019-11-07 DIAGNOSIS — Z362 Encounter for other antenatal screening follow-up: Secondary | ICD-10-CM | POA: Insufficient documentation

## 2019-11-07 DIAGNOSIS — O99212 Obesity complicating pregnancy, second trimester: Secondary | ICD-10-CM

## 2019-11-07 DIAGNOSIS — O359XX0 Maternal care for (suspected) fetal abnormality and damage, unspecified, not applicable or unspecified: Secondary | ICD-10-CM

## 2019-11-07 DIAGNOSIS — O358XX0 Maternal care for other (suspected) fetal abnormality and damage, not applicable or unspecified: Secondary | ICD-10-CM | POA: Diagnosis present

## 2019-11-07 DIAGNOSIS — Z363 Encounter for antenatal screening for malformations: Secondary | ICD-10-CM

## 2019-11-09 ENCOUNTER — Other Ambulatory Visit: Payer: Self-pay

## 2019-11-09 ENCOUNTER — Encounter: Payer: Self-pay | Admitting: Obstetrics & Gynecology

## 2019-11-09 ENCOUNTER — Ambulatory Visit (INDEPENDENT_AMBULATORY_CARE_PROVIDER_SITE_OTHER): Payer: Medicaid Other | Admitting: Obstetrics & Gynecology

## 2019-11-09 VITALS — BP 122/70 | HR 72 | Wt 203.0 lb

## 2019-11-09 DIAGNOSIS — Z3482 Encounter for supervision of other normal pregnancy, second trimester: Secondary | ICD-10-CM

## 2019-11-09 DIAGNOSIS — Z3480 Encounter for supervision of other normal pregnancy, unspecified trimester: Secondary | ICD-10-CM

## 2019-11-09 DIAGNOSIS — O35EXX0 Maternal care for other (suspected) fetal abnormality and damage, fetal genitourinary anomalies, not applicable or unspecified: Secondary | ICD-10-CM

## 2019-11-09 DIAGNOSIS — F329 Major depressive disorder, single episode, unspecified: Secondary | ICD-10-CM

## 2019-11-09 DIAGNOSIS — O99210 Obesity complicating pregnancy, unspecified trimester: Secondary | ICD-10-CM

## 2019-11-09 DIAGNOSIS — Z3A23 23 weeks gestation of pregnancy: Secondary | ICD-10-CM

## 2019-11-09 DIAGNOSIS — O358XX0 Maternal care for other (suspected) fetal abnormality and damage, not applicable or unspecified: Secondary | ICD-10-CM

## 2019-11-09 DIAGNOSIS — F419 Anxiety disorder, unspecified: Secondary | ICD-10-CM

## 2019-11-09 DIAGNOSIS — O99212 Obesity complicating pregnancy, second trimester: Secondary | ICD-10-CM

## 2019-11-09 NOTE — Patient Instructions (Addendum)
Return to office for any scheduled appointments. Call the office or go to the MAU at [facility] if:  You begin to have strong, frequent contractions  Your water breaks.  Sometimes it is a big gush of fluid, sometimes it is just a trickle that keeps getting your panties wet or running down your legs  You have vaginal bleeding.  It is normal to have a small amount of spotting if your cervix was checked.   You do not feel your baby moving like normal.  If you do not, get something to eat and drink and lay down and focus on feeling your baby move.   If your baby is still not moving like normal, you should call the office or go to MAU.  Any other obstetric concerns.  TDaP Vaccine Pregnancy Get the Whooping Cough Vaccine While You Are Pregnant (CDC)  It is important for women to get the whooping cough vaccine in the third trimester of each pregnancy. Vaccines are the best way to prevent this disease. There are 2 different whooping cough vaccines. Both vaccines combine protection against whooping cough, tetanus and diphtheria, but they are for different age groups: Tdap: for everyone 11 years or older, including pregnant women  DTaP: for children 2 months through 6 years of age  You need the whooping cough vaccine during each of your pregnancies The recommended time to get the shot is during your 27th through 36th week of pregnancy, preferably during the earlier part of this time period. The Centers for Disease Control and Prevention (CDC) recommends that pregnant women receive the whooping cough vaccine for adolescents and adults (called Tdap vaccine) during the third trimester of each pregnancy. The recommended time to get the shot is during your 27th through 36th week of pregnancy, preferably during the earlier part of this time period. This replaces the original recommendation that pregnant women get the vaccine only if they had not previously received it. The  American College of Obstetricians and Gynecologists and the American College of Nurse-Midwives support this recommendation.  You should get the whooping cough vaccine while pregnant to pass protection to your baby frame support disabled and/or not supported in this browser  Learn why Laura decided to get the whooping cough vaccine in her 3rd trimester of pregnancy and how her baby girl was born with some protection against the disease. Also available on YouTube. After receiving the whooping cough vaccine, your body will create protective antibodies (proteins produced by the body to fight off diseases) and pass some of them to your baby before birth. These antibodies provide your baby some short-term protection against whooping cough in early life. These antibodies can also protect your baby from some of the more serious complications that come along with whooping cough. Your protective antibodies are at their highest about 2 weeks after getting the vaccine, but it takes time to pass them to your baby. So the preferred time to get the whooping cough vaccine is early in your third trimester. The amount of whooping cough antibodies in your body decreases over time. That is why CDC recommends you get a whooping cough vaccine during each pregnancy. Doing so allows each of your babies to get the greatest number of protective antibodies from you. This means each of your babies will get the best protection possible against this disease.  Getting the whooping cough vaccine while pregnant is better than getting the vaccine after you give birth Whooping cough vaccination during   pregnancy is ideal so your baby will have short-term protection as soon as he is born. This early protection is important because your baby will not start getting his whooping cough vaccines until he is 2 months old. These first few months of life are when your baby is at greatest risk for catching whooping cough. This is also when he's at  greatest risk for having severe, potentially life-threating complications from the infection. To avoid that gap in protection, it is best to get a whooping cough vaccine during pregnancy. You will then pass protection to your baby before he is born. To continue protecting your baby, he should get whooping cough vaccines starting at 2 months old. You may never have gotten the Tdap vaccine before and did not get it during this pregnancy. If so, you should make sure to get the vaccine immediately after you give birth, before leaving the hospital or birthing center. It will take about 2 weeks before your body develops protection (antibodies) in response to the vaccine. Once you have protection from the vaccine, you are less likely to give whooping cough to your newborn while caring for him. But remember, your baby will still be at risk for catching whooping cough from others. A recent study looked to see how effective Tdap was at preventing whooping cough in babies whose mothers got the vaccine while pregnant or in the hospital after giving birth. The study found that getting Tdap between 27 through 36 weeks of pregnancy is 85% more effective at preventing whooping cough in babies younger than 2 months old. Blood tests cannot tell if you need a whooping cough vaccine There are no blood tests that can tell you if you have enough antibodies in your body to protect yourself or your baby against whooping cough. Even if you have been sick with whooping cough in the past or previously received the vaccine, you still should get the vaccine during each pregnancy. Breastfeeding may pass some protective antibodies onto your baby By breastfeeding, you may pass some antibodies you have made in response to the vaccine to your baby. When you get a whooping cough vaccine during your pregnancy, you will have antibodies in your breast milk that you can share with your baby as soon as your milk comes in. However, your baby will not  get protective antibodies immediately if you wait to get the whooping cough vaccine until after delivering your baby. This is because it takes about 2 weeks for your body to create antibodies. Learn more about the health benefits of breastfeeding.  

## 2019-11-09 NOTE — Progress Notes (Signed)
PRENATAL VISIT NOTE  Subjective:  Stephanie KirschnerMaiya Simon is a 24 y.o. G2P1001 at 677w0d being seen today for ongoing prenatal care.  She is currently monitored for the following issues for this low-risk pregnancy and has Menorrhagia with regular cycle; Supervision of other normal pregnancy, antepartum; BMI 30s; Obesity in pregnancy; Anxiety and depression; and Fetal renal anomaly, single gestation on their problem list.  Patient reports no complaints.  Contractions: Not present.  .  Movement: Present. Denies leaking of fluid.   The following portions of the patient's history were reviewed and updated as appropriate: allergies, current medications, past family history, past medical history, past social history, past surgical history and problem list.   Objective:   Vitals:   11/09/19 1409  BP: 122/70  Pulse: 72  Weight: (!) 203 lb (92.1 kg)    Fetal Status: Fetal Heart Rate (bpm): 148   Movement: Present     General:  Alert, oriented and cooperative. Patient is in no acute distress.  Skin: Skin is warm and dry. No rash noted.   Cardiovascular: Normal heart rate noted  Respiratory: Normal respiratory effort, no problems with respiration noted  Abdomen: Soft, gravid, appropriate for gestational age.  Pain/Pressure: Absent     Pelvic: Cervical exam deferred        Extremities: Normal range of motion.  Edema: None  Mental Status: Normal mood and affect. Normal behavior. Normal judgment and thought content.   Imaging: US MFM OB DETAIL +14 WK  Result Date: 10/10/2019 ----------------------------------------------------------------------  OBSTETRICS REPORT                       (Signed Final 10/10/2019 06:00 pm) ---------------------------------------------------------------------- Patient Info  ID #:       [id number]                          D.O.B.:  13-Apr-1996 (23 yrs)  Name:       Stephanie Simon                     Visit Date: 10/10/2019 01:49 pm  ---------------------------------------------------------------------- Performed By  Attending:        Ma RingsVictor Fang MD         [address]. Address[address]     [address]  Performed By:     Lenise ArenaHannah Bazemore        Location[facility]  Referred By:      Federico FlakeKIMBERLY NILES                    NEWTON MD ---------------------------------------------------------------------- Orders  #  Description  Code        Ordered By  1  [place] MFM OB DETAIL +14 WK               [id number]    Lyndel Safe ----------------------------------------------------------------------  #  Order #                     Accession #                Episode #  1  [id number]                   [id number]                 [id number] ---------------------------------------------------------------------- Indications  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)  [redacted] weeks gestation of pregnancy                Z3A.18  Encounter for antenatal screening for          Z36.3  malformations  Obesity complicating pregnancy, second         O99.212  trimester (BMI 36) ---------------------------------------------------------------------- Vital Signs  Weight (lb): 195                               Height:        5'2"  BMI:         35.66 ---------------------------------------------------------------------- Fetal Evaluation  Num Of Fetuses:         1  Fetal Heart Rate(bpm):  150  Cardiac Activity:       Observed  Presentation:           Transverse, head to maternal right  Placenta:               Posterior  P. Cord Insertion:      Visualized, central  Amniotic Fluid  AFI FV:      Within normal limits                              Largest Pocket(cm)                              3.4 ---------------------------------------------------------------------- Biometry  BPD:      39.1  mm      G. Age:  17w 6d         17  %    CI:        66.28   %    70 - 86                                                          FL/HC:      16.8   %    16.1 - 18.3  HC:       154   mm     G. Age:  18w 3d         26  %    HC/AC:      1.08        1.09 - 1.39  AC:       143  mm     G. Age:  19w 5d         76  %    FL/BPD:     66.0   %  FL:       25.8  mm     G. Age:  17w 6d         15  %    FL/AC:      18.0   %    20 - 24  CER:      20.4  mm     G. Age:  19w 4d         83  %  NFT:       4.7  mm  LV:          7  mm  CM:        4.3  mm  Est. FW:     254  gm      0 lb 9 oz     46  % ---------------------------------------------------------------------- OB History  Gravidity:    2         Term:   1  Living:       1 ---------------------------------------------------------------------- Gestational Age  LMP:           18w 5d        Date:  06/01/19                 EDD:   03/07/20  U/S Today:     18w 3d                                        EDD:   03/09/20  Best:          18w 5d     Det. By:  LMP  (06/01/19)          EDD:   03/07/20 ---------------------------------------------------------------------- Anatomy  Cranium:               Appears normal         Aortic Arch:            Appears normal  Cavum:                 Appears normal         Ductal Arch:            Appears normal  Ventricles:            Appears normal         Diaphragm:              Appears normal  Choroid Plexus:        Appears normal         Stomach:                Appears normal, left                                                                        sided  Cerebellum:            Appears normal  Abdomen:                Appears normal  Posterior Fossa:       Appears normal         Abdominal Wall:         Appears nml (cord                                                                        insert, abd wall)  Nuchal Fold:           Appears normal         Cord Vessels:           Appears normal (3                                                                         vessel cord)  Face:                  Appears normal         Kidneys:                Absent LT kidney                         (orbits and profile)                                                                        vs pelvic kidney  Lips:                  Appears normal         Bladder:                Appears normal  Thoracic:              Appears normal         Spine:                  Not well visualized  Heart:                 Appears normal         Upper Extremities:      Appears normal                         (4CH, axis, and                         situs)  RVOT:                  Appears normal         Lower Extremities:  Appears normal  LVOT:                  Appears normal  Other:  Parents do not wish to know sex of fetus. Heels and 5th digit          visualized. Nasal bone visualized. Technically difficult due to fetal          position. ---------------------------------------------------------------------- Cervix Uterus Adnexa  Cervix  Length:           3.79  cm.  Normal appearance by transabdominal scan.  Uterus  No abnormality visualized.  Right Ovary  No adnexal mass visualized.  Left Ovary  No adnexal mass visualized.  Cul De Sac  No free fluid seen.  Adnexa  No abnormality visualized. ---------------------------------------------------------------------- Comments  This patient was seen for a detailed fetal anatomy scan.  She denies any significant past medical history and denies  any problems in her current pregnancy.  She has not had any screening tests for fetal aneuploidy  drawn in her current pregnancy.  She was informed that the fetal growth and amniotic fluid  level were appropriate for her gestational age.  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  The  right fetal kidney appeared in the normal location.  The  patient was advised that we will continue to follow her closely  due to the left pelvic kidney noted today.  She was reassured  that there is normal  renal function noted, as a normal-  appearing filled fetal bladder and normal amniotic fluid were  noted today.  The patient was informed that anomalies may be missed due  to technical limitations. If the fetus is in a suboptimal position  or maternal habitus is increased, visualization of the fetus in  the maternal uterus may be impaired.  Due to the probable left pelvic kidney, we will continue to  follow her with serial ultrasound exams to assess this finding.  A follow-up exam was scheduled in 4 weeks.  The patient  should have a cell free DNA test to screen for fetal  aneuploidy drawn at her next prenatal visit. ----------------------------------------------------------------------                   Ma Rings, MD Electronically Signed Final Report   10/10/2019 06:00 pm ----------------------------------------------------------------------  [place] MFM OB FOLLOW UP  Result Date: 11/07/2019 ----------------------------------------------------------------------  OBSTETRICS REPORT                       (Signed Final 11/07/2019 07:02 pm) ---------------------------------------------------------------------- Patient Info  ID #:       [id number]                          D.O.B.:  12-05-1995 (23 yrs)  Name:       Stephanie Forth Dinsmore                     Visit Date: 11/07/2019 12:57 pm ---------------------------------------------------------------------- Performed By  Attending:        Noralee Space MD        Ref. Address:     [address]  Performed By:     Sandi Mealy        Location[facility]  Referred By:      Federico Flake MD ---------------------------------------------------------------------- Orders   #  Description                           Code        Ordered By  1  [place] MFM OB FOLLOW UP                   [id number]    Rosana Hoes ----------------------------------------------------------------------  #  Order #                     Accession #                Episode #  1  [id number]                   [id number]                 [id number] ---------------------------------------------------------------------- Indications  Encounter for antenatal screening for          Z36.3  malformations  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 Cord Insertion:      Visualized  Amniotic Fluid  AFI FV:      Within normal limits                              Largest Pocket(cm)                              4.3 ---------------------------------------------------------------------- Biometry  BPD:      52.8  mm     G. Age:  22w 0d         21  %    CI:        65.84   %    70 - 86                                                          FL/HC:      17.2   %    19.2 - 20.8  HC:      208.8  mm     G. Age:  23w 0d         47  %    HC/AC:      1.10        1.05 - 1.21  AC:      189.3  mm     G. Age:  23w 5d         73  %    FL/BPD:     68.2   %    71 - 87  FL:         36  mm     G. Age:  21w 3d          7  %    FL/AC:      19.0   %    20 - 24  HUM:      35.8  mm     G. Age:  22w 3d         37  %  LV:        4.6  mm  Est. FW:     525  gm      1 lb 3 oz     41  %  ---------------------------------------------------------------------- OB History  Gravidity:    2         Term:   1  Living:       1 ---------------------------------------------------------------------- Gestational Age  LMP:           22w 5d        Date:  06/01/19                 EDD:   03/07/20  U/S Today:     22w 4d                                        EDD:   03/08/20  Best:          22w 5d     Det. By:  LMP  (06/01/19)          EDD:   03/07/20 ---------------------------------------------------------------------- Anatomy  Cranium:               Appears normal         Aortic Arch:            Previously seen  Cavum:                 Appears normal         Ductal Arch:            Previously seen  Ventricles:            Appears normal         Diaphragm:              Appears normal  Choroid Plexus:        Previously seen        Stomach:                Appears normal, left                                                                        sided  Cerebellum:            Previously seen        Abdomen:                Appears normal  Posterior Fossa:       Previously seen        Abdominal Wall:         Previously seen  Nuchal Fold:           Not applicable (>20    Cord Vessels:           Previously seen                         wks GA)  Face:                  Orbits and profile     Kidneys:                Abnormal, Lt Kid                         previously seen  Lips:                  Previously seen        Bladder:                Appears normal  Thoracic:              Appears normal         Spine:                  Limited views                                                                        appear normal  Heart:  Appears normal         Upper Extremities:      Previously seen                         (4CH, axis, and                         situs)  RVOT:                  Previously seen        Lower Extremities:      Previously seen  LVOT:                  Previously seen  Other:  Parents do not  wish to know sex of fetus. Heels and 5th digit          visualized. Nasal bone visualized. Technically difficult due to fetal          position. 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  Amniotic fluid is normal and good fetal activity seen.  Fetal  growth is appropriate for gestational age.  Right kidney is  normally located and appears normal.  Left kidney appears  lower (possibly pelvic) and has multiple cysts.  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  Dilated fetal  bowel is unlikely because of absence of peristalsis.  I have reassured the patient of normal functioning right  kidney (normal amniotic fluid).  Kidneys are better evaluated with advancing gestation  because of the presence of perinephric fat.  I also informed  the patient that only postnatal evaluation will definitely be  diagnosed the condition.  In the absence of oligohydramnios,  early delivery is not indicated. ---------------------------------------------------------------------- Recommendations  -An appointment was made for her to return in 6 weeks for  fetal growth and renal assessments. ----------------------------------------------------------------------                  Noralee Space, MD Electronically Signed Final Report   11/07/2019 07:02 pm ----------------------------------------------------------------------  Assessment and Plan:  Pregnancy: G2P1001 at [redacted]w[redacted]d 1. Fetal renal anomaly, single gestation Follow up MFM scans  2. Obesity in pregnancy TWG 23 lb.  Already had Nutritionist consult, know to follow proper diet and exercise to avoid a lot of weight gain.  3. Anxiety and depression Stable. No medications.   4. [redacted] weeks gestation of pregnancy 5. Supervision of other normal pregnancy, antepartum Preterm labor symptoms and general obstetric precautions  including but not limited to vaginal bleeding, contractions, leaking of fluid and fetal movement were reviewed in detail with the patient. Please refer to After Visit Summary for other counseling recommendations.   Return in about 4 weeks (around 12/07/2019) for 2 hr GTT, 3rd trimester labs, TDap, OFFICE OB Visit.  Future Appointments  Date Time Provider Department Center  12/20/2019 12:30 PM [facility] NURSE [facility]  12/20/2019 12:45 PM [facility] US5 [facility]    Jaynie Collins, MD

## 2019-12-07 ENCOUNTER — Ambulatory Visit (INDEPENDENT_AMBULATORY_CARE_PROVIDER_SITE_OTHER): Payer: Medicaid Other | Admitting: Obstetrics and Gynecology

## 2019-12-07 ENCOUNTER — Other Ambulatory Visit: Payer: Self-pay

## 2019-12-07 VITALS — BP 122/74 | HR 80 | Wt 204.8 lb

## 2019-12-07 DIAGNOSIS — O35EXX0 Maternal care for other (suspected) fetal abnormality and damage, fetal genitourinary anomalies, not applicable or unspecified: Secondary | ICD-10-CM

## 2019-12-07 DIAGNOSIS — Z3A27 27 weeks gestation of pregnancy: Secondary | ICD-10-CM

## 2019-12-07 DIAGNOSIS — O99210 Obesity complicating pregnancy, unspecified trimester: Secondary | ICD-10-CM

## 2019-12-07 DIAGNOSIS — Z3480 Encounter for supervision of other normal pregnancy, unspecified trimester: Secondary | ICD-10-CM

## 2019-12-07 DIAGNOSIS — O358XX0 Maternal care for other (suspected) fetal abnormality and damage, not applicable or unspecified: Secondary | ICD-10-CM

## 2019-12-07 DIAGNOSIS — E669 Obesity, unspecified: Secondary | ICD-10-CM

## 2019-12-07 NOTE — Progress Notes (Signed)
Prenatal Visit Note Date: 12/07/2019 Clinic: [facility]  Subjective:  Stephanie Simon is a 24 y.o. G2P1001 at [redacted]w[redacted]d being seen today for ongoing prenatal care.  She is currently monitored for the following issues for this high-risk pregnancy and has Menorrhagia with regular cycle; Supervision of other normal pregnancy, antepartum; BMI 30s; Obesity in pregnancy; Anxiety and depression; and Fetal renal anomaly, single gestation on their problem list.  Patient reports no complaints.   Contractions: Not present. Vag. Bleeding: None.  Movement: Present. Denies leaking of fluid.   The following portions of the patient's history were reviewed and updated as appropriate: allergies, current medications, past family history, past medical history, past social history, past surgical history and problem list. Problem list updated.  Objective:   Vitals:   12/07/19 1404  BP: 122/74  Pulse: 80  Weight: 204 lb 12.8 oz (92.9 kg)    Fetal Status: Fetal Heart Rate (bpm): 147 Fundal Height: 27 cm Movement: Present     General:  Alert, oriented and cooperative. Patient is in no acute distress.  Skin: Skin is warm and dry. No rash noted.   Cardiovascular: Normal heart rate noted  Respiratory: Normal respiratory effort, no problems with respiration noted  Abdomen: Soft, gravid, appropriate for gestational age. Pain/Pressure: Absent     Pelvic:  Cervical exam deferred        Extremities: Normal range of motion.  Edema: None  Mental Status: Normal mood and affect. Normal behavior. Normal judgment and thought content.   Urinalysis:      Assessment and Plan:  Pregnancy: G2P1001 at [redacted]w[redacted]d  1. Fetal renal anomaly, single gestation Continue with mfm serial growth ultrasounds  2. Supervision of other normal pregnancy, antepartum 28wk labs nv  3. Obesity in pregnancy  4. BMI 30s Weight stable  Preterm labor symptoms and general obstetric precautions including but not limited to vaginal  bleeding, contractions, leaking of fluid and fetal movement were reviewed in detail with the patient. Please refer to After Visit Summary for other counseling recommendations.  Return in about 1 week (around 12/14/2019) for low risk, 2hr GTT, in person.   [place] Bing, MD

## 2019-12-14 NOTE — Progress Notes (Signed)
° °  PRENATAL VISIT NOTE  Subjective:  Stephanie Simon is a 24 y.o. G2P1001 at [redacted]w[redacted]d being seen today for ongoing prenatal care.  She is currently monitored for the following issues for this high-risk pregnancy and has Menorrhagia with regular cycle; Supervision of other normal pregnancy, antepartum; BMI 30s; Obesity in pregnancy; Anxiety and depression; and Fetal renal anomaly, single gestation on their problem list.  Patient reports no complaints.  Contractions: Not present. Vag. Bleeding: None.  Movement: Present. Denies leaking of fluid.   The following portions of the patient's history were reviewed and updated as appropriate: allergies, current medications, past family history, past medical history, past social history, past surgical history and problem list.   Objective:   Vitals:   12/15/19 0841  BP: 108/70  Pulse: 79  Weight: 204 lb (92.5 kg)    Fetal Status: Fetal Heart Rate (bpm): 150   Movement: Present     General:  Alert, oriented and cooperative. Patient is in no acute distress.  Skin: Skin is warm and dry. No rash noted.   Cardiovascular: Normal heart rate noted  Respiratory: Normal respiratory effort, no problems with respiration noted  Abdomen: Soft, gravid, appropriate for gestational age.  Pain/Pressure: Absent     Pelvic: Cervical exam deferred        Extremities: Normal range of motion.  Edema: None  Mental Status: Normal mood and affect. Normal behavior. Normal judgment and thought content.   Assessment and Plan:  Pregnancy: G2P1001 at [redacted]w[redacted]d 1. Fetal renal anomaly, single gestation Follow up scan at [facility] on 12/20/19.  2. [redacted] weeks gestation of pregnancy 3. Supervision of other normal pregnancy, antepartum Third trimester labs today. Tdap vaccine given. - CBC; Future - Glucose Tolerance, 2 Hours w/1 Hour; Future - HIV Antibody (routine testing w rflx); Future - RPR; Future Preterm labor symptoms and general obstetric precautions including but not limited to  vaginal bleeding, contractions, leaking of fluid and fetal movement were reviewed in detail with the patient. Please refer to After Visit Summary for other counseling recommendations.   Return in about 2 weeks (around 12/29/2019) for OFFICE OB Visit.  Future Appointments  Date Time Provider Department Center  12/20/2019 12:30 PM [facility] NURSE [facility]  12/20/2019 12:45 PM [facility]  01/02/2020  1:45 PM Kenae Lindquist, Jethro Bastos, MD [facility]    Jaynie Collins, MD

## 2019-12-15 ENCOUNTER — Other Ambulatory Visit: Payer: Self-pay

## 2019-12-15 ENCOUNTER — Encounter: Payer: Self-pay | Admitting: Obstetrics & Gynecology

## 2019-12-15 ENCOUNTER — Ambulatory Visit (INDEPENDENT_AMBULATORY_CARE_PROVIDER_SITE_OTHER): Payer: Medicaid Other | Admitting: Obstetrics & Gynecology

## 2019-12-15 VITALS — BP 108/70 | HR 79 | Wt 204.0 lb

## 2019-12-15 DIAGNOSIS — Z23 Encounter for immunization: Secondary | ICD-10-CM | POA: Diagnosis not present

## 2019-12-15 DIAGNOSIS — Z3A28 28 weeks gestation of pregnancy: Secondary | ICD-10-CM | POA: Diagnosis not present

## 2019-12-15 DIAGNOSIS — Z3480 Encounter for supervision of other normal pregnancy, unspecified trimester: Secondary | ICD-10-CM

## 2019-12-15 DIAGNOSIS — O35EXX0 Maternal care for other (suspected) fetal abnormality and damage, fetal genitourinary anomalies, not applicable or unspecified: Secondary | ICD-10-CM

## 2019-12-15 DIAGNOSIS — O358XX0 Maternal care for other (suspected) fetal abnormality and damage, not applicable or unspecified: Secondary | ICD-10-CM

## 2019-12-15 LAB — CBC
Hematocrit: 33.9 % — ABNORMAL LOW (ref 34.0–46.6)
Hemoglobin: 10.2 g/dL — ABNORMAL LOW (ref 11.1–15.9)
MCH: 22.7 pg — ABNORMAL LOW (ref 26.6–33.0)
MCHC: 30.1 g/dL — ABNORMAL LOW (ref 31.5–35.7)
MCV: 76 fL — ABNORMAL LOW (ref 79–97)
Platelets: 324 10*3/uL (ref 150–450)
RBC: 4.49 x10E6/uL (ref 3.77–5.28)
RDW: 15.3 % (ref 11.7–15.4)
WBC: 11 10*3/uL — ABNORMAL HIGH (ref 3.4–10.8)

## 2019-12-15 NOTE — Patient Instructions (Addendum)
Return to office for any scheduled appointments. Call the office or go to the [facility]U at [facility] if:  You begin to have strong, frequent contractions  Your water breaks.  Sometimes it is a big gush of fluid, sometimes it is just a trickle that keeps getting your panties wet or running down your legs  You have vaginal bleeding.  It is normal to have a small amount of spotting if your cervix was checked.   You do not feel your baby moving like normal.  If you do not, get something to eat and drink and lay down and focus on feeling your baby move.   If your baby is still not moving like normal, you should call the office or go to MAU.  Any other obstetric concerns.   Third Trimester of Pregnancy The third trimester is from week 28 through week 40 (months 7 through 9). The third trimester is a time when the unborn baby (fetus) is growing rapidly. At the end of the ninth month, the fetus is about 20 inches in length and weighs 6-10 pounds. Body changes during your third trimester Your body will continue to go through many changes during pregnancy. The changes vary from woman to woman. During the third trimester:  Your weight will continue to increase. You can expect to gain 25-35 pounds (11-16 kg) by the end of the pregnancy.  You may begin to get stretch marks on your hips, abdomen, and breasts.  You may urinate more often because the fetus is moving lower into your pelvis and pressing on your bladder.  You may develop or continue to have heartburn. This is caused by increased hormones that slow down muscles in the digestive tract.  You may develop or continue to have constipation because increased hormones slow digestion and cause the muscles that push waste through your intestines to relax.  You may develop hemorrhoids. These are swollen veins (varicose veins) in the rectum that can itch or be painful.  You may develop swollen, bulging veins (varicose veins)  in your legs.  You may have increased body aches in the pelvis, back, or thighs. This is due to weight gain and increased hormones that are relaxing your joints.  You may have changes in your hair. These can include thickening of your hair, rapid growth, and changes in texture. Some women also have hair loss during or after pregnancy, or hair that feels dry or thin. Your hair will most likely return to normal after your baby is born.  Your breasts will continue to grow and they will continue to become tender. A yellow fluid (colostrum) may leak from your breasts. This is the first milk you are producing for your baby.  Your belly button may stick out.  You may notice more swelling in your hands, face, or ankles.  You may have increased tingling or numbness in your hands, arms, and legs. The skin on your belly may also feel numb.  You may feel short of breath because of your expanding uterus.  You may have more problems sleeping. This can be caused by the size of your belly, increased need to urinate, and an increase in your body's metabolism.  You may notice the fetus "dropping," or moving lower in your abdomen (lightening).  You may have increased vaginal discharge.  You may notice your joints feel loose and you may have pain around your pelvic bone. What to expect at prenatal visits You will have   prenatal exams every 2 weeks until week 36. Then you will have weekly prenatal exams. During a routine prenatal visit:  You will be weighed to make sure you and the baby are growing normally.  Your blood pressure will be taken.  Your abdomen will be measured to track your baby's growth.  The fetal heartbeat will be listened to.  Any test results from the previous visit will be discussed.  You may have a cervical check near your due date to see if your cervix has softened or thinned (effaced).  You will be tested for Group B streptococcus. This happens between 35 and 37 weeks. Your  health care provider may ask you:  What your birth plan is.  How you are feeling.  If you are feeling the baby move.  If you have had any abnormal symptoms, such as leaking fluid, bleeding, severe headaches, or abdominal cramping.  If you are using any tobacco products, including cigarettes, chewing tobacco, and electronic cigarettes.  If you have any questions. Other tests or screenings that may be performed during your third trimester include:  Blood tests that check for low iron levels (anemia).  Fetal testing to check the health, activity level, and growth of the fetus. Testing is done if you have certain medical conditions or if there are problems during the pregnancy.  Nonstress test (NST). This test checks the health of your baby to make sure there are no signs of problems, such as the baby not getting enough oxygen. During this test, a belt is placed around your belly. The baby is made to move, and its heart rate is monitored during movement. What is false labor? False labor is a condition in which you feel small, irregular tightenings of the muscles in the womb (contractions) that usually go away with rest, changing position, or drinking water. These are called Braxton Hicks contractions. Contractions may last for hours, days, or even weeks before true labor sets in. If contractions come at regular intervals, become more frequent, increase in intensity, or become painful, you should see your health care provider. What are the signs of labor?  Abdominal cramps.  Regular contractions that start at 10 minutes apart and become stronger and more frequent with time.  Contractions that start on the top of the uterus and spread down to the lower abdomen and back.  Increased pelvic pressure and dull back pain.  A watery or bloody mucus discharge that comes from the vagina.  Leaking of amniotic fluid. This is also known as your "water breaking." It could be a slow trickle or a gush.  Let your health care provider know if it has a color or strange odor. If you have any of these signs, call your health care provider right away, even if it is before your due date. Follow these instructions at home: Medicines  Follow your health care provider's instructions regarding medicine use. Specific medicines may be either safe or unsafe to take during pregnancy.  Take a prenatal vitamin that contains at least 600 micrograms (mcg) of folic acid.  If you develop constipation, try taking a stool softener if your health care provider approves. Eating and drinking   Eat a balanced diet that includes fresh fruits and vegetables, whole grains, good sources of protein such as meat, eggs, or tofu, and low-fat dairy. Your health care provider will help you determine the amount of weight gain that is right for you.  Avoid raw meat and uncooked cheese. These carry germs that   can cause birth defects in the baby.  If you have low calcium intake from food, talk to your health care provider about whether you should take a daily calcium supplement.  Eat four or five small meals rather than three large meals a day.  Limit foods that are high in fat and processed sugars, such as fried and sweet foods.  To prevent constipation: ? Drink enough fluid to keep your urine clear or pale yellow. ? Eat foods that are high in fiber, such as fresh fruits and vegetables, whole grains, and beans. Activity  Exercise only as directed by your health care provider. Most women can continue their usual exercise routine during pregnancy. Try to exercise for 30 minutes at least 5 days a week. Stop exercising if you experience uterine contractions.  Avoid heavy lifting.  Do not exercise in extreme heat or humidity, or at high altitudes.  Wear low-heel, comfortable shoes.  Practice good posture.  You may continue to have sex unless your health care provider tells you otherwise. Relieving pain and  discomfort  Take frequent breaks and rest with your legs elevated if you have leg cramps or low back pain.  Take warm sitz baths to soothe any pain or discomfort caused by hemorrhoids. Use hemorrhoid cream if your health care provider approves.  Wear a good support bra to prevent discomfort from breast tenderness.  If you develop varicose veins: ? Wear support pantyhose or compression stockings as told by your healthcare provider. ? Elevate your feet for 15 minutes, 3-4 times a day. Prenatal care  Write down your questions. Take them to your prenatal visits.  Keep all your prenatal visits as told by your health care provider. This is important. Safety  Wear your seat belt at all times when driving.  Make a list of emergency phone numbers, including numbers for family, friends, the hospital, and police and fire departments. General instructions  Avoid cat litter boxes and soil used by cats. These carry germs that can cause birth defects in the baby. If you have a cat, ask someone to clean the litter box for you.  Do not travel far distances unless it is absolutely necessary and only with the approval of your health care provider.  Do not use hot tubs, steam rooms, or saunas.  Do not drink alcohol.  Do not use any products that contain nicotine or tobacco, such as cigarettes and e-cigarettes. If you need help quitting, ask your health care provider.  Do not use any medicinal herbs or unprescribed drugs. These chemicals affect the formation and growth of the baby.  Do not douche or use tampons or scented sanitary pads.  Do not cross your legs for long periods of time.  To prepare for the arrival of your baby: ? Take prenatal classes to understand, practice, and ask questions about labor and delivery. ? Make a trial run to the hospital. ? Visit the hospital and tour the maternity area. ? Arrange for maternity or paternity leave through employers. ? Arrange for family and  friends to take care of pets while you are in the hospital. ? Purchase a rear-facing car seat and make sure you know how to install it in your car. ? Pack your hospital bag. ? Prepare the baby's nursery. Make sure to remove all pillows and stuffed animals from the baby's crib to prevent suffocation.  Visit your dentist if you have not gone during your pregnancy. Use a soft toothbrush to brush your teeth and be   gentle when you floss. Contact a health care provider if:  You are unsure if you are in labor or if your water has broken.  You become dizzy.  You have mild pelvic cramps, pelvic pressure, or nagging pain in your abdominal area.  You have lower back pain.  You have persistent nausea, vomiting, or diarrhea.  You have an unusual or bad smelling vaginal discharge.  You have pain when you urinate. Get help right away if:  Your water breaks before 37 weeks.  You have regular contractions less than 5 minutes apart before 37 weeks.  You have a fever.  You are leaking fluid from your vagina.  You have spotting or bleeding from your vagina.  You have severe abdominal pain or cramping.  You have rapid weight loss or weight gain.  You have shortness of breath with chest pain.  You notice sudden or extreme swelling of your face, hands, ankles, feet, or legs.  Your baby makes fewer than 10 movements in 2 hours.  You have severe headaches that do not go away when you take medicine.  You have vision changes. Summary  The third trimester is from week 28 through week 40, months 7 through 9. The third trimester is a time when the unborn baby (fetus) is growing rapidly.  During the third trimester, your discomfort may increase as you and your baby continue to gain weight. You may have abdominal, leg, and back pain, sleeping problems, and an increased need to urinate.  During the third trimester your breasts will keep growing and they will continue to become tender. A yellow  fluid (colostrum) may leak from your breasts. This is the first milk you are producing for your baby.  False labor is a condition in which you feel small, irregular tightenings of the muscles in the womb (contractions) that eventually go away. These are called Braxton Hicks contractions. Contractions may last for hours, days, or even weeks before true labor sets in.  Signs of labor can include: abdominal cramps; regular contractions that start at 10 minutes apart and become stronger and more frequent with time; watery or bloody mucus discharge that comes from the vagina; increased pelvic pressure and dull back pain; and leaking of amniotic fluid. This information is not intended to replace advice given to you by your health care provider. Make sure you discuss any questions you have with your health care provider. Document Revised: 07/22/2018 Document Reviewed: 05/06/2016 Elsevier Patient Education  2020 [employer]  

## 2019-12-16 LAB — RPR: RPR Ser Ql: NONREACTIVE

## 2019-12-16 LAB — GLUCOSE TOLERANCE, 2 HOURS W/ 1HR
Glucose, 1 hour: 118 mg/dL (ref 65–179)
Glucose, 2 hour: 120 mg/dL (ref 65–152)
Glucose, Fasting: 83 mg/dL (ref 65–91)

## 2019-12-16 LAB — HIV ANTIBODY (ROUTINE TESTING W REFLEX): HIV Screen 4th Generation wRfx: NONREACTIVE

## 2019-12-20 ENCOUNTER — Ambulatory Visit: Payer: Medicaid Other | Admitting: *Deleted

## 2019-12-20 ENCOUNTER — Ambulatory Visit: Payer: Medicaid Other | Attending: Obstetrics and Gynecology

## 2019-12-20 ENCOUNTER — Other Ambulatory Visit: Payer: Self-pay | Admitting: *Deleted

## 2019-12-20 ENCOUNTER — Other Ambulatory Visit: Payer: Self-pay

## 2019-12-20 ENCOUNTER — Encounter: Payer: Self-pay | Admitting: *Deleted

## 2019-12-20 DIAGNOSIS — Z3480 Encounter for supervision of other normal pregnancy, unspecified trimester: Secondary | ICD-10-CM | POA: Insufficient documentation

## 2019-12-20 DIAGNOSIS — O359XX0 Maternal care for (suspected) fetal abnormality and damage, unspecified, not applicable or unspecified: Secondary | ICD-10-CM | POA: Diagnosis not present

## 2019-12-20 DIAGNOSIS — O99213 Obesity complicating pregnancy, third trimester: Secondary | ICD-10-CM | POA: Diagnosis not present

## 2019-12-20 DIAGNOSIS — O35EXX0 Maternal care for other (suspected) fetal abnormality and damage, fetal genitourinary anomalies, not applicable or unspecified: Secondary | ICD-10-CM

## 2019-12-20 DIAGNOSIS — Z3A28 28 weeks gestation of pregnancy: Secondary | ICD-10-CM | POA: Diagnosis not present

## 2019-12-20 DIAGNOSIS — Z362 Encounter for other antenatal screening follow-up: Secondary | ICD-10-CM

## 2019-12-20 DIAGNOSIS — O358XX0 Maternal care for other (suspected) fetal abnormality and damage, not applicable or unspecified: Secondary | ICD-10-CM | POA: Diagnosis present

## 2020-01-02 ENCOUNTER — Other Ambulatory Visit: Payer: Self-pay

## 2020-01-02 ENCOUNTER — Ambulatory Visit (INDEPENDENT_AMBULATORY_CARE_PROVIDER_SITE_OTHER): Payer: Medicaid Other | Admitting: Obstetrics & Gynecology

## 2020-01-02 VITALS — BP 119/76 | HR 86 | Wt 206.0 lb

## 2020-01-02 DIAGNOSIS — Z23 Encounter for immunization: Secondary | ICD-10-CM

## 2020-01-02 DIAGNOSIS — O358XX0 Maternal care for other (suspected) fetal abnormality and damage, not applicable or unspecified: Secondary | ICD-10-CM

## 2020-01-02 DIAGNOSIS — O35EXX0 Maternal care for other (suspected) fetal abnormality and damage, fetal genitourinary anomalies, not applicable or unspecified: Secondary | ICD-10-CM

## 2020-01-02 DIAGNOSIS — Z3480 Encounter for supervision of other normal pregnancy, unspecified trimester: Secondary | ICD-10-CM

## 2020-01-02 DIAGNOSIS — Z3A30 30 weeks gestation of pregnancy: Secondary | ICD-10-CM

## 2020-01-02 NOTE — Progress Notes (Signed)
   PRENATAL VISIT NOTE  Subjective:  Stephanie Simon is a 24 y.o. G2P1001 at [redacted]w[redacted]d being seen today for ongoing prenatal care.  She is currently monitored for the following issues for this low-risk pregnancy and has Menorrhagia with regular cycle; Supervision of other normal pregnancy, antepartum; BMI 30s; Obesity in pregnancy; Anxiety and depression; and Fetal left pelvic kidney on their problem list.  Patient reports no complaints.  Contractions: Not present. Vag. Bleeding: None.  Movement: Present. Denies leaking of fluid.   The following portions of the patient's history were reviewed and updated as appropriate: allergies, current medications, past family history, past medical history, past social history, past surgical history and problem list.   Objective:   Vitals:   01/02/20 1356  BP: 119/76  Pulse: 86  Weight: 206 lb (93.4 kg)    Fetal Status: Fetal Heart Rate (bpm): 142   Movement: Present     General:  Alert, oriented and cooperative. Patient is in no acute distress.  Skin: Skin is warm and dry. No rash noted.   Cardiovascular: Normal heart rate noted  Respiratory: Normal respiratory effort, no problems with respiration noted  Abdomen: Soft, gravid, appropriate for gestational age.  Pain/Pressure: Absent     Pelvic: Cervical exam deferred        Extremities: Normal range of motion.  Edema: Trace  Mental Status: Normal mood and affect. Normal behavior. Normal judgment and thought content.   Assessment and Plan:  Pregnancy: G2P1001 at [redacted]w[redacted]d 1. Fetal left pelvic kidney 2. [redacted] weeks gestation of pregnancy 3. Supervision of other normal pregnancy, antepartum No issues. Follow up scan already scheduled. Preterm labor symptoms and general obstetric precautions including but not limited to vaginal bleeding, contractions, leaking of fluid and fetal movement were reviewed in detail with the patient. Please refer to After Visit Summary for other counseling recommendations.   Return  in about 2 weeks (around 01/16/2020) for OFFICE OB Visit.  Future Appointments  Date Time Provider Department Center  01/16/2020  2:00 PM Meriden Bing, MD [facility]  01/31/2020  1:45 PM [facility] NURSE [facility]  01/31/2020  2:00 PM [facility] US1 [facility]    Jaynie Collins, MD

## 2020-01-02 NOTE — Patient Instructions (Signed)
Return to office for any scheduled appointments. Call the office or go to the MAU at [facility] [place] if:  You begin to have strong, frequent contractions  Your water breaks.  Sometimes it is a big gush of fluid, sometimes it is just a trickle that keeps getting your panties wet or running down your legs  You have vaginal bleeding.  It is normal to have a small amount of spotting if your cervix was checked.   You do not feel your baby moving like normal.  If you do not, get something to eat and drink and lay down and focus on feeling your baby move.   If your baby is still not moving like normal, you should call the office or go to MAU.  Any other obstetric concerns.   Third Trimester of Pregnancy The third trimester is from week 28 through week 40 (months 7 through 9). The third trimester is a time when the unborn baby (fetus) is growing rapidly. At the end of the ninth month, the fetus is about 20 inches in length and weighs 6-10 pounds. Body changes during your third trimester Your body will continue to go through many changes during pregnancy. The changes vary from woman to woman. During the third trimester:  Your weight will continue to increase. You can expect to gain 25-35 pounds (11-16 kg) by the end of the pregnancy.  You may begin to get stretch marks on your hips, abdomen, and breasts.  You may urinate more often because the fetus is moving lower into your pelvis and pressing on your bladder.  You may develop or continue to have heartburn. This is caused by increased hormones that slow down muscles in the digestive tract.  You may develop or continue to have constipation because increased hormones slow digestion and cause the muscles that push waste through your intestines to relax.  You may develop hemorrhoids. These are swollen veins (varicose veins) in the rectum that can itch or be painful.  You may develop swollen, bulging veins (varicose veins)  in your legs.  You may have increased body aches in the pelvis, back, or thighs. This is due to weight gain and increased hormones that are relaxing your joints.  You may have changes in your hair. These can include thickening of your hair, rapid growth, and changes in texture. Some women also have hair loss during or after pregnancy, or hair that feels dry or thin. Your hair will most likely return to normal after your baby is born.  Your breasts will continue to grow and they will continue to become tender. A yellow fluid (colostrum) may leak from your breasts. This is the first milk you are producing for your baby.  Your belly button may stick out.  You may notice more swelling in your hands, face, or ankles.  You may have increased tingling or numbness in your hands, arms, and legs. The skin on your belly may also feel numb.  You may feel short of breath because of your expanding uterus.  You may have more problems sleeping. This can be caused by the size of your belly, increased need to urinate, and an increase in your body's metabolism.  You may notice the fetus "dropping," or moving lower in your abdomen (lightening).  You may have increased vaginal discharge.  You may notice your joints feel loose and you may have pain around your pelvic bone. What to expect at prenatal visits You will have   prenatal exams every 2 weeks until week 36. Then you will have weekly prenatal exams. During a routine prenatal visit:  You will be weighed to make sure you and the baby are growing normally.  Your blood pressure will be taken.  Your abdomen will be measured to track your baby's growth.  The fetal heartbeat will be listened to.  Any test results from the previous visit will be discussed.  You may have a cervical check near your due date to see if your cervix has softened or thinned (effaced).  You will be tested for Group B streptococcus. This happens between 35 and 37 weeks. Your  health care provider may ask you:  What your birth plan is.  How you are feeling.  If you are feeling the baby move.  If you have had any abnormal symptoms, such as leaking fluid, bleeding, severe headaches, or abdominal cramping.  If you are using any tobacco products, including cigarettes, chewing tobacco, and electronic cigarettes.  If you have any questions. Other tests or screenings that may be performed during your third trimester include:  Blood tests that check for low iron levels (anemia).  Fetal testing to check the health, activity level, and growth of the fetus. Testing is done if you have certain medical conditions or if there are problems during the pregnancy.  Nonstress test (NST). This test checks the health of your baby to make sure there are no signs of problems, such as the baby not getting enough oxygen. During this test, a belt is placed around your belly. The baby is made to move, and its heart rate is monitored during movement. What is false labor? False labor is a condition in which you feel small, irregular tightenings of the muscles in the womb (contractions) that usually go away with rest, changing position, or drinking water. These are called Braxton Hicks contractions. Contractions may last for hours, days, or even weeks before true labor sets in. If contractions come at regular intervals, become more frequent, increase in intensity, or become painful, you should see your health care provider. What are the signs of labor?  Abdominal cramps.  Regular contractions that start at 10 minutes apart and become stronger and more frequent with time.  Contractions that start on the top of the uterus and spread down to the lower abdomen and back.  Increased pelvic pressure and dull back pain.  A watery or bloody mucus discharge that comes from the vagina.  Leaking of amniotic fluid. This is also known as your "water breaking." It could be a slow trickle or a gush.  Let your health care provider know if it has a color or strange odor. If you have any of these signs, call your health care provider right away, even if it is before your due date. Follow these instructions at home: Medicines  Follow your health care provider's instructions regarding medicine use. Specific medicines may be either safe or unsafe to take during pregnancy.  Take a prenatal vitamin that contains at least 600 micrograms (mcg) of folic acid.  If you develop constipation, try taking a stool softener if your health care provider approves. Eating and drinking   Eat a balanced diet that includes fresh fruits and vegetables, whole grains, good sources of protein such as meat, eggs, or tofu, and low-fat dairy. Your health care provider will help you determine the amount of weight gain that is right for you.  Avoid raw meat and uncooked cheese. These carry germs that   can cause birth defects in the baby.  If you have low calcium intake from food, talk to your health care provider about whether you should take a daily calcium supplement.  Eat four or five small meals rather than three large meals a day.  Limit foods that are high in fat and processed sugars, such as fried and sweet foods.  To prevent constipation: ? Drink enough fluid to keep your urine clear or pale yellow. ? Eat foods that are high in fiber, such as fresh fruits and vegetables, whole grains, and beans. Activity  Exercise only as directed by your health care provider. Most women can continue their usual exercise routine during pregnancy. Try to exercise for 30 minutes at least 5 days a week. Stop exercising if you experience uterine contractions.  Avoid heavy lifting.  Do not exercise in extreme heat or humidity, or at high altitudes.  Wear low-heel, comfortable shoes.  Practice good posture.  You may continue to have sex unless your health care provider tells you otherwise. Relieving pain and  discomfort  Take frequent breaks and rest with your legs elevated if you have leg cramps or low back pain.  Take warm sitz baths to soothe any pain or discomfort caused by hemorrhoids. Use hemorrhoid cream if your health care provider approves.  Wear a good support bra to prevent discomfort from breast tenderness.  If you develop varicose veins: ? Wear support pantyhose or compression stockings as told by your healthcare provider. ? Elevate your feet for 15 minutes, 3-4 times a day. Prenatal care  Write down your questions. Take them to your prenatal visits.  Keep all your prenatal visits as told by your health care provider. This is important. Safety  Wear your seat belt at all times when driving.  Make a list of emergency phone numbers, including numbers for family, friends, the hospital, and police and fire departments. General instructions  Avoid cat litter boxes and soil used by cats. These carry germs that can cause birth defects in the baby. If you have a cat, ask someone to clean the litter box for you.  Do not travel far distances unless it is absolutely necessary and only with the approval of your health care provider.  Do not use hot tubs, steam rooms, or saunas.  Do not drink alcohol.  Do not use any products that contain nicotine or tobacco, such as cigarettes and e-cigarettes. If you need help quitting, ask your health care provider.  Do not use any medicinal herbs or unprescribed drugs. These chemicals affect the formation and growth of the baby.  Do not douche or use tampons or scented sanitary pads.  Do not cross your legs for long periods of time.  To prepare for the arrival of your baby: ? Take prenatal classes to understand, practice, and ask questions about labor and delivery. ? Make a trial run to the hospital. ? Visit the hospital and tour the maternity area. ? Arrange for maternity or paternity leave through employers. ? Arrange for family and  friends to take care of pets while you are in the hospital. ? Purchase a rear-facing car seat and make sure you know how to install it in your car. ? Pack your hospital bag. ? Prepare the baby's nursery. Make sure to remove all pillows and stuffed animals from the baby's crib to prevent suffocation.  Visit your dentist if you have not gone during your pregnancy. Use a soft toothbrush to brush your teeth and be   gentle when you floss. Contact a health care provider if:  You are unsure if you are in labor or if your water has broken.  You become dizzy.  You have mild pelvic cramps, pelvic pressure, or nagging pain in your abdominal area.  You have lower back pain.  You have persistent nausea, vomiting, or diarrhea.  You have an unusual or bad smelling vaginal discharge.  You have pain when you urinate. Get help right away if:  Your water breaks before 37 weeks.  You have regular contractions less than 5 minutes apart before 37 weeks.  You have a fever.  You are leaking fluid from your vagina.  You have spotting or bleeding from your vagina.  You have severe abdominal pain or cramping.  You have rapid weight loss or weight gain.  You have shortness of breath with chest pain.  You notice sudden or extreme swelling of your face, hands, ankles, feet, or legs.  Your baby makes fewer than 10 movements in 2 hours.  You have severe headaches that do not go away when you take medicine.  You have vision changes. Summary  The third trimester is from week 28 through week 40, months 7 through 9. The third trimester is a time when the unborn baby (fetus) is growing rapidly.  During the third trimester, your discomfort may increase as you and your baby continue to gain weight. You may have abdominal, leg, and back pain, sleeping problems, and an increased need to urinate.  During the third trimester your breasts will keep growing and they will continue to become tender. A yellow  fluid (colostrum) may leak from your breasts. This is the first milk you are producing for your baby.  False labor is a condition in which you feel small, irregular tightenings of the muscles in the womb (contractions) that eventually go away. These are called Braxton Hicks contractions. Contractions may last for hours, days, or even weeks before true labor sets in.  Signs of labor can include: abdominal cramps; regular contractions that start at 10 minutes apart and become stronger and more frequent with time; watery or bloody mucus discharge that comes from the vagina; increased pelvic pressure and dull back pain; and leaking of amniotic fluid. This information is not intended to replace advice given to you by your health care provider. Make sure you discuss any questions you have with your health care provider. Document Revised: 07/22/2018 Document Reviewed: 05/06/2016 Elsevier Patient Education  2020 [employer]  

## 2020-01-16 ENCOUNTER — Other Ambulatory Visit: Payer: Self-pay

## 2020-01-16 ENCOUNTER — Ambulatory Visit (INDEPENDENT_AMBULATORY_CARE_PROVIDER_SITE_OTHER): Payer: Medicaid Other | Admitting: Obstetrics and Gynecology

## 2020-01-16 VITALS — BP 102/67 | HR 79 | Wt 208.0 lb

## 2020-01-16 DIAGNOSIS — Z3480 Encounter for supervision of other normal pregnancy, unspecified trimester: Secondary | ICD-10-CM

## 2020-01-16 DIAGNOSIS — O358XX0 Maternal care for other (suspected) fetal abnormality and damage, not applicable or unspecified: Secondary | ICD-10-CM

## 2020-01-16 DIAGNOSIS — O35EXX0 Maternal care for other (suspected) fetal abnormality and damage, fetal genitourinary anomalies, not applicable or unspecified: Secondary | ICD-10-CM

## 2020-01-16 DIAGNOSIS — Z3A32 32 weeks gestation of pregnancy: Secondary | ICD-10-CM

## 2020-01-16 NOTE — Progress Notes (Signed)
Prenatal Visit Note Date: 01/16/2020 Clinic: [facility]  Subjective:  Stephanie Simon is a 24 y.o. G2P1001 at [redacted]w[redacted]d being seen today for ongoing prenatal care.  She is currently monitored for the following issues for this high-risk pregnancy and has Menorrhagia with regular cycle; Supervision of other normal pregnancy, antepartum; BMI 30s; Obesity in pregnancy; Anxiety and depression; and Fetal left pelvic kidney on their problem list.  Patient reports no complaints.   Contractions: Not present. Vag. Bleeding: None.  Movement: Present. Denies leaking of fluid.   The following portions of the patient's history were reviewed and updated as appropriate: allergies, current medications, past family history, past medical history, past social history, past surgical history and problem list. Problem list updated.  Objective:   Vitals:   01/16/20 1418  BP: 102/67  Pulse: 79  Weight: 208 lb (94.3 kg)    Fetal Status: Fetal Heart Rate (bpm): 143   Movement: Present     General:  Alert, oriented and cooperative. Patient is in no acute distress.  Skin: Skin is warm and dry. No rash noted.   Cardiovascular: Normal heart rate noted  Respiratory: Normal respiratory effort, no problems with respiration noted  Abdomen: Soft, gravid, appropriate for gestational age. Pain/Pressure: Absent     Pelvic:  Cervical exam deferred        Extremities: Normal range of motion.  Edema: Trace  Mental Status: Normal mood and affect. Normal behavior. Normal judgment and thought content.   Urinalysis:      Assessment and Plan:  Pregnancy: G2P1001 at [redacted]w[redacted]d  1. Fetal left pelvic kidney Continue with qmonth growth u/s  2. Supervision of other normal pregnancy, antepartum [place].   Preterm labor symptoms and general obstetric precautions including but not limited to vaginal bleeding, contractions, leaking of fluid and fetal movement were reviewed in detail with the patient. Please refer to  After Visit Summary for other counseling recommendations.  Return in about 2 weeks (around 01/30/2020) for low risk, high risk, in person.   [place] Bing, MD

## 2020-01-30 ENCOUNTER — Other Ambulatory Visit: Payer: Self-pay

## 2020-01-30 ENCOUNTER — Ambulatory Visit (INDEPENDENT_AMBULATORY_CARE_PROVIDER_SITE_OTHER): Payer: Medicaid Other | Admitting: Obstetrics & Gynecology

## 2020-01-30 VITALS — BP 105/69 | HR 88 | Wt 210.0 lb

## 2020-01-30 DIAGNOSIS — O09299 Supervision of pregnancy with other poor reproductive or obstetric history, unspecified trimester: Secondary | ICD-10-CM

## 2020-01-30 DIAGNOSIS — Z3480 Encounter for supervision of other normal pregnancy, unspecified trimester: Secondary | ICD-10-CM

## 2020-01-30 NOTE — Progress Notes (Signed)
   PRENATAL VISIT NOTE  Subjective:  Stephanie Simon is a 24 y.o. G2P1001 at [redacted]w[redacted]d being seen today for ongoing prenatal care.  She is currently monitored for the following issues for this low-risk pregnancy and has Menorrhagia with regular cycle; Supervision of other normal pregnancy, antepartum; BMI 30s; Obesity in pregnancy; Anxiety and depression; and Fetal left pelvic kidney on their problem list.  Patient reports no complaints.  Contractions: Not present. Vag. Bleeding: None.  Movement: Present. Denies leaking of fluid.   The following portions of the patient's history were reviewed and updated as appropriate: allergies, current medications, past family history, past medical history, past social history, past surgical history and problem list.   Objective:   Vitals:   01/30/20 1434  BP: 105/69  Pulse: 88  Weight: 210 lb (95.3 kg)    Fetal Status: Fetal Heart Rate (bpm): 145 Fundal Height: 36 cm Movement: Present     General:  Alert, oriented and cooperative. Patient is in no acute distress.  Skin: Skin is warm and dry. No rash noted.   Cardiovascular: Normal heart rate noted  Respiratory: Normal respiratory effort, no problems with respiration noted  Abdomen: Soft, gravid, appropriate for gestational age.  Pain/Pressure: Absent     Pelvic: Cervical exam deferred        Extremities: Normal range of motion.  Edema: Trace  Mental Status: Normal mood and affect. Normal behavior. Normal judgment and thought content.   Assessment and Plan:  Pregnancy: G2P1001 at [redacted]w[redacted]d 1. Supervision of other normal pregnancy, antepartum Has f/u US  2. History of macrosomia in infant in prior pregnancy, currently pregnant 9 lb 3 oz 41 weeks  Preterm labor symptoms and general obstetric precautions including but not limited to vaginal bleeding, contractions, leaking of fluid and fetal movement were reviewed in detail with the patient. Please refer to After Visit Summary for other counseling  recommendations.   Return in about 2 weeks (around 02/13/2020) for GBS.  Future Appointments  Date Time Provider Department Center  01/31/2020  1:45 PM [facility] NURSE [facility]  01/31/2020  2:00 PM [facility] US1 [facility]  02/13/2020  2:15 PM Anyanwu, Jethro Bastos, MD [facility]  02/27/2020  2:15 PM Adam Phenix, MD [facility]  03/05/2020  2:00 PM [place] Bing, MD [facility]    Scheryl Darter, MD

## 2020-01-30 NOTE — Patient Instructions (Signed)

## 2020-01-31 ENCOUNTER — Ambulatory Visit: Payer: Medicaid Other | Admitting: *Deleted

## 2020-01-31 ENCOUNTER — Encounter: Payer: Self-pay | Admitting: *Deleted

## 2020-01-31 ENCOUNTER — Other Ambulatory Visit: Payer: Self-pay | Admitting: *Deleted

## 2020-01-31 ENCOUNTER — Other Ambulatory Visit: Payer: Self-pay

## 2020-01-31 ENCOUNTER — Ambulatory Visit: Payer: Medicaid Other | Attending: Obstetrics and Gynecology

## 2020-01-31 DIAGNOSIS — Z3A34 34 weeks gestation of pregnancy: Secondary | ICD-10-CM

## 2020-01-31 DIAGNOSIS — Z3480 Encounter for supervision of other normal pregnancy, unspecified trimester: Secondary | ICD-10-CM | POA: Insufficient documentation

## 2020-01-31 DIAGNOSIS — O358XX0 Maternal care for other (suspected) fetal abnormality and damage, not applicable or unspecified: Secondary | ICD-10-CM | POA: Diagnosis not present

## 2020-01-31 DIAGNOSIS — O3663X0 Maternal care for excessive fetal growth, third trimester, not applicable or unspecified: Secondary | ICD-10-CM | POA: Diagnosis not present

## 2020-01-31 DIAGNOSIS — O99213 Obesity complicating pregnancy, third trimester: Secondary | ICD-10-CM | POA: Diagnosis not present

## 2020-01-31 DIAGNOSIS — O35EXX0 Maternal care for other (suspected) fetal abnormality and damage, fetal genitourinary anomalies, not applicable or unspecified: Secondary | ICD-10-CM

## 2020-01-31 DIAGNOSIS — Z362 Encounter for other antenatal screening follow-up: Secondary | ICD-10-CM

## 2020-01-31 DIAGNOSIS — E669 Obesity, unspecified: Secondary | ICD-10-CM

## 2020-01-31 DIAGNOSIS — O358XX1 Maternal care for other (suspected) fetal abnormality and damage, fetus 1: Secondary | ICD-10-CM

## 2020-01-31 DIAGNOSIS — O35EXX1 Maternal care for other (suspected) fetal abnormality and damage, fetal genitourinary anomalies, fetus 1: Secondary | ICD-10-CM

## 2020-02-13 ENCOUNTER — Other Ambulatory Visit: Payer: Self-pay

## 2020-02-13 ENCOUNTER — Other Ambulatory Visit (HOSPITAL_COMMUNITY)
Admission: RE | Admit: 2020-02-13 | Discharge: 2020-02-13 | Disposition: A | Discharge status: Home / Self Care | Payer: Medicaid Other | Source: Ambulatory Visit | Attending: Obstetrics & Gynecology | Admitting: Obstetrics & Gynecology

## 2020-02-13 ENCOUNTER — Ambulatory Visit (INDEPENDENT_AMBULATORY_CARE_PROVIDER_SITE_OTHER): Payer: Medicaid Other | Admitting: Obstetrics & Gynecology

## 2020-02-13 VITALS — BP 121/75 | HR 97 | Wt 215.0 lb

## 2020-02-13 DIAGNOSIS — Z3A36 36 weeks gestation of pregnancy: Secondary | ICD-10-CM | POA: Insufficient documentation

## 2020-02-13 DIAGNOSIS — Z3493 Encounter for supervision of normal pregnancy, unspecified, third trimester: Secondary | ICD-10-CM | POA: Diagnosis not present

## 2020-02-13 DIAGNOSIS — Z3480 Encounter for supervision of other normal pregnancy, unspecified trimester: Secondary | ICD-10-CM

## 2020-02-13 NOTE — Progress Notes (Signed)
   PRENATAL VISIT NOTE  Subjective:  Stephanie Simon is a 24 y.o. G2P1001 at [redacted]w[redacted]d being seen today for ongoing prenatal care.  She is currently monitored for the following issues for this low-risk pregnancy and has Menorrhagia with regular cycle; Supervision of other normal pregnancy, antepartum; BMI 30s; Obesity in pregnancy; Anxiety and depression; and Fetal left pelvic kidney on their problem list.  Patient reports occasional contractions.  Contractions: Irregular. Vag. Bleeding: None.  Movement: Present. Denies leaking of fluid.   The following portions of the patient's history were reviewed and updated as appropriate: allergies, current medications, past family history, past medical history, past social history, past surgical history and problem list.   Objective:   Vitals:   02/13/20 1431  BP: 121/75  Pulse: 97  Weight: 215 lb (97.5 kg)    Fetal Status: Fetal Heart Rate (bpm): 135 Fundal Height: 37 cm Movement: Present  Presentation: Vertex  General:  Alert, oriented and cooperative. Patient is in no acute distress.  Skin: Skin is warm and dry. No rash noted.   Cardiovascular: Normal heart rate noted  Respiratory: Normal respiratory effort, no problems with respiration noted  Abdomen: Soft, gravid, appropriate for gestational age.  Pain/Pressure: Absent     Pelvic: Cervical exam performed in the presence of a chaperone Dilation: 3 Effacement (%): 50 Station: -3  Extremities: Normal range of motion.  Edema: Trace  Mental Status: Normal mood and affect. Normal behavior. Normal judgment and thought content.   Assessment and Plan:  Pregnancy: G2P1001 at [redacted]w[redacted]d 1. [redacted] weeks gestation of pregnancy 2. Supervision of other normal pregnancy, antepartum Reassured about cervical exam. Pelvic cultures done, will follow up results and manage accordingly. - Strep Gp B NAA - GC/Chlamydia probe amp ([place])not at [facility] Preterm labor symptoms and general obstetric precautions including but  not limited to vaginal bleeding, contractions, leaking of fluid and fetal movement were reviewed in detail with the patient. Please refer to After Visit Summary for other counseling recommendations.   Return in about 1 week (around 02/20/2020) for OFFICE OB Visit.  Future Appointments  Date Time Provider Department Center  02/20/2020  2:00 PM Tereso Newcomer, MD [facility]  02/21/2020  3:30 PM [facility] NURSE [facility]  02/21/2020  3:45 PM [facility] US5 [facility]  02/27/2020  2:15 PM Adam Phenix, MD [facility]  03/05/2020  2:00 PM [place] Bing, MD [facility]    Jaynie Collins, MD

## 2020-02-13 NOTE — Patient Instructions (Signed)
Return to office for any scheduled appointments. Call the office or go to the [facility]  if:  You begin to have strong, frequent contractions  Your water breaks.  Sometimes it is a big gush of fluid, sometimes it is just a trickle that keeps getting your panties wet or running down your legs  You have vaginal bleeding.  It is normal to have a small amount of spotting if your cervix was checked.   You do not feel your baby moving like normal.  If you do not, get something to eat and drink and lay down and focus on feeling your baby move.   If your baby is still not moving like normal, you should call the office or go to MAU.  Any other obstetric concerns.   

## 2020-02-14 LAB — GC/CHLAMYDIA PROBE AMP (~~LOC~~) NOT AT ARMC
Chlamydia: NEGATIVE
Comment: NEGATIVE
Comment: NORMAL
Neisseria Gonorrhea: NEGATIVE

## 2020-02-15 LAB — STREP GP B NAA: Strep Gp B NAA: NEGATIVE

## 2020-02-20 ENCOUNTER — Encounter: Payer: Self-pay | Admitting: Obstetrics & Gynecology

## 2020-02-20 ENCOUNTER — Ambulatory Visit (INDEPENDENT_AMBULATORY_CARE_PROVIDER_SITE_OTHER): Payer: Medicaid Other | Admitting: Obstetrics & Gynecology

## 2020-02-20 ENCOUNTER — Other Ambulatory Visit: Payer: Self-pay

## 2020-02-20 VITALS — BP 131/78 | HR 96 | Wt 216.0 lb

## 2020-02-20 DIAGNOSIS — Z3480 Encounter for supervision of other normal pregnancy, unspecified trimester: Secondary | ICD-10-CM

## 2020-02-20 DIAGNOSIS — Z3A37 37 weeks gestation of pregnancy: Secondary | ICD-10-CM

## 2020-02-20 NOTE — Patient Instructions (Signed)
Return to office for any scheduled appointments. Call the office or go to the MAU at [facility] [place] if:  You begin to have strong, frequent contractions  Your water breaks.  Sometimes it is a big gush of fluid, sometimes it is just a trickle that keeps getting your panties wet or running down your legs  You have vaginal bleeding.  It is normal to have a small amount of spotting if your cervix was checked.   You do not feel your baby moving like normal.  If you do not, get something to eat and drink and lay down and focus on feeling your baby move.   If your baby is still not moving like normal, you should call the office or go to MAU.  Any other obstetric concerns.   

## 2020-02-20 NOTE — Progress Notes (Signed)
   PRENATAL VISIT NOTE  Subjective:  Stephanie Simon is a 24 y.o. G2P1001 at [redacted]w[redacted]d being seen today for ongoing prenatal care.  She is currently monitored for the following issues for this low-risk pregnancy and has Menorrhagia with regular cycle; Supervision of other normal pregnancy, antepartum; BMI 30s; Obesity in pregnancy; Anxiety and depression; and Fetal left pelvic kidney on their problem list.  Patient reports no complaints.  Contractions: Not present. Vag. Bleeding: None.  Movement: Present. Denies leaking of fluid.   The following portions of the patient's history were reviewed and updated as appropriate: allergies, current medications, past family history, past medical history, past social history, past surgical history and problem list.   Objective:   Vitals:   02/20/20 1420  BP: 131/78  Pulse: 96  Weight: 216 lb (98 kg)    Fetal Status: Fetal Heart Rate (bpm): 139 Fundal Height: 38 cm Movement: Present  Presentation: Vertex  General:  Alert, oriented and cooperative. Patient is in no acute distress.  Skin: Skin is warm and dry. No rash noted.   Cardiovascular: Normal heart rate noted  Respiratory: Normal respiratory effort, no problems with respiration noted  Abdomen: Soft, gravid, appropriate for gestational age.  Pain/Pressure: Absent     Pelvic: Cervical exam performed in the presence of a chaperone Dilation: 3 Effacement (%): 50 Station: -3  Extremities: Normal range of motion.  Edema: Trace  Mental Status: Normal mood and affect. Normal behavior. Normal judgment and thought content.   Assessment and Plan:  Pregnancy: G2P1001 at [redacted]w[redacted]d 1. [redacted] weeks gestation of pregnancy 2. Supervision of other normal pregnancy, antepartum No cervical change, patient reassured.  Term labor symptoms and general obstetric precautions including but not limited to vaginal bleeding, contractions, leaking of fluid and fetal movement were reviewed in detail with the patient. Please refer to  After Visit Summary for other counseling recommendations.   Return in about 1 week (around 02/27/2020) for OFFICE OB VISIT (MD or APP).  Future Appointments  Date Time Provider Department Center  02/21/2020  3:30 PM [facility] NURSE [facility]  02/21/2020  3:45 PM [facility] US5 [facility]  03/01/2020  1:15 PM Jessabelle Markiewicz, Jethro Bastos, MD [facility]  03/05/2020  2:00 PM Arabi Bing, MD [facility]    Jaynie Collins, MD

## 2020-02-21 ENCOUNTER — Ambulatory Visit: Payer: Medicaid Other | Admitting: *Deleted

## 2020-02-21 ENCOUNTER — Ambulatory Visit: Payer: Medicaid Other | Attending: Obstetrics and Gynecology

## 2020-02-21 ENCOUNTER — Encounter: Payer: Self-pay | Admitting: *Deleted

## 2020-02-21 ENCOUNTER — Other Ambulatory Visit: Payer: Self-pay

## 2020-02-21 DIAGNOSIS — O3663X0 Maternal care for excessive fetal growth, third trimester, not applicable or unspecified: Secondary | ICD-10-CM | POA: Diagnosis not present

## 2020-02-21 DIAGNOSIS — O99213 Obesity complicating pregnancy, third trimester: Secondary | ICD-10-CM | POA: Diagnosis not present

## 2020-02-21 DIAGNOSIS — O35EXX0 Maternal care for other (suspected) fetal abnormality and damage, fetal genitourinary anomalies, not applicable or unspecified: Secondary | ICD-10-CM

## 2020-02-21 DIAGNOSIS — Z3480 Encounter for supervision of other normal pregnancy, unspecified trimester: Secondary | ICD-10-CM | POA: Insufficient documentation

## 2020-02-21 DIAGNOSIS — O358XX0 Maternal care for other (suspected) fetal abnormality and damage, not applicable or unspecified: Secondary | ICD-10-CM

## 2020-02-21 DIAGNOSIS — O358XX1 Maternal care for other (suspected) fetal abnormality and damage, fetus 1: Secondary | ICD-10-CM

## 2020-02-21 DIAGNOSIS — O35EXX1 Maternal care for other (suspected) fetal abnormality and damage, fetal genitourinary anomalies, fetus 1: Secondary | ICD-10-CM

## 2020-02-21 DIAGNOSIS — E669 Obesity, unspecified: Secondary | ICD-10-CM | POA: Diagnosis not present

## 2020-02-21 DIAGNOSIS — Z362 Encounter for other antenatal screening follow-up: Secondary | ICD-10-CM

## 2020-02-21 DIAGNOSIS — Z3A37 37 weeks gestation of pregnancy: Secondary | ICD-10-CM

## 2020-02-21 NOTE — Progress Notes (Signed)
C/o"spotting from cervical exam 02/20/20"

## 2020-02-27 ENCOUNTER — Encounter: Payer: Medicaid Other | Admitting: Obstetrics & Gynecology

## 2020-03-01 ENCOUNTER — Encounter: Payer: Self-pay | Admitting: Obstetrics & Gynecology

## 2020-03-01 ENCOUNTER — Telehealth (HOSPITAL_COMMUNITY): Payer: Self-pay | Admitting: *Deleted

## 2020-03-01 ENCOUNTER — Other Ambulatory Visit: Payer: Self-pay

## 2020-03-01 ENCOUNTER — Encounter (HOSPITAL_COMMUNITY): Payer: Self-pay | Admitting: *Deleted

## 2020-03-01 ENCOUNTER — Ambulatory Visit (INDEPENDENT_AMBULATORY_CARE_PROVIDER_SITE_OTHER): Payer: Medicaid Other | Admitting: Obstetrics & Gynecology

## 2020-03-01 VITALS — BP 129/82 | HR 84 | Wt 215.6 lb

## 2020-03-01 DIAGNOSIS — O09299 Supervision of pregnancy with other poor reproductive or obstetric history, unspecified trimester: Secondary | ICD-10-CM

## 2020-03-01 DIAGNOSIS — O3663X0 Maternal care for excessive fetal growth, third trimester, not applicable or unspecified: Secondary | ICD-10-CM

## 2020-03-01 DIAGNOSIS — Z3A39 39 weeks gestation of pregnancy: Secondary | ICD-10-CM

## 2020-03-01 DIAGNOSIS — O358XX0 Maternal care for other (suspected) fetal abnormality and damage, not applicable or unspecified: Secondary | ICD-10-CM

## 2020-03-01 DIAGNOSIS — O35EXX0 Maternal care for other (suspected) fetal abnormality and damage, fetal genitourinary anomalies, not applicable or unspecified: Secondary | ICD-10-CM

## 2020-03-01 DIAGNOSIS — Z3480 Encounter for supervision of other normal pregnancy, unspecified trimester: Secondary | ICD-10-CM

## 2020-03-01 NOTE — Patient Instructions (Signed)
Return to office for any scheduled appointments. Call the office or go to the MAU at [facility] if:  You begin to have strong, frequent contractions  Your water breaks.  Sometimes it is a big gush of fluid, sometimes it is just a trickle that keeps getting your panties wet or running down your legs  You have vaginal bleeding.  It is normal to have a small amount of spotting if your cervix was checked.   You do not feel your baby moving like normal.  If you do not, get something to eat and drink and lay down and focus on feeling your baby move.   If your baby is still not moving like normal, you should call the office or go to MAU.  Any other obstetric concerns.   

## 2020-03-01 NOTE — Progress Notes (Signed)
PRENATAL VISIT NOTE  Subjective:  Stephanie Simon is a 24 y.o. G2P1001 at [redacted]w[redacted]d being seen today for ongoing prenatal care.  She is currently monitored for the following issues for this low-risk pregnancy and has Supervision of other normal pregnancy, antepartum; BMI 30s; Obesity in pregnancy; Anxiety and depression; and Fetal left pelvic kidney on their problem list.  Patient reports no complaints.  Contractions: Not present. Vag. Bleeding: None.  Movement: Present. Denies leaking of fluid.   The following portions of the patient's history were reviewed and updated as appropriate: allergies, current medications, past family history, past medical history, past social history, past surgical history and problem list.   Objective:   Vitals:   03/01/20 1324  BP: 129/82  Pulse: 84  Weight: 215 lb 9.6 oz (97.8 kg)    Fetal Status: Fetal Heart Rate (bpm): 152 Fundal Height: 40 cm Movement: Present  Presentation: Vertex  General:  Alert, oriented and cooperative. Patient is in no acute distress.  Skin: Skin is warm and dry. No rash noted.   Cardiovascular: Normal heart rate noted  Respiratory: Normal respiratory effort, no problems with respiration noted  Abdomen: Soft, gravid, appropriate for gestational age.  Pain/Pressure: Absent     Pelvic: Cervical exam performed in the presence of a chaperone Dilation: 3 Effacement (%): 50 Station: -3  Extremities: Normal range of motion.  Edema: Trace  Mental Status: Normal mood and affect. Normal behavior. Normal judgment and thought content.    Imaging: [place] MFM OB FOLLOW UP  Result Date: 02/21/2020 ----------------------------------------------------------------------  OBSTETRICS REPORT                       (Signed Final 02/21/2020 04:38 pm) ---------------------------------------------------------------------- Patient Info  ID #:       [id number]                          D.O.B.:  [birth year] (24 yrs)  Name:       Stephanie Simon                     Visit  Date: 02/21/2020 03:46 pm ---------------------------------------------------------------------- Performed By  Attending:        Noralee Space MD        Ref. Address:     [address]                                                             [address]  Performed By:     Reinaldo Raddle            [facility]  Referred By:      Federico Flake MD ---------------------------------------------------------------------- Orders  #  Description                           Code        Ordered By  1  [place] MFM OB FOLLOW UP                   54562.56    Noralee Space ----------------------------------------------------------------------  #  Order #                     Accession #                Episode #  1  [id number]                   [id number]                 [id number] ---------------------------------------------------------------------- Indications  Encounter for other antenatal screening        Z36.2  follow-up  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)  Obesity complicating pregnancy, third          O99.213  trimester (pregravid BMI 32)  Maternal care for excessive fetal growth,      O36.63X0  third trimester, fetus unspecified  [redacted] weeks gestation of pregnancy                Z3A.37 ---------------------------------------------------------------------- Vital Signs                                                 Height:        5'2" ---------------------------------------------------------------------- Fetal Evaluation  Num Of Fetuses:         1  Fetal Heart Rate(bpm):  160  Cardiac Activity:       Observed  Presentation:           Cephalic  Placenta:               Posterior  P. Cord Insertion:      Visualized, central  Amniotic Fluid  AFI FV:       Within normal limits  AFI Sum(cm)     %Tile       Largest Pocket(cm)  18.66           73          7.14  RUQ(cm)       RLQ(cm)       LUQ(cm)        LLQ(cm)  7.14          3.53          2.3            5.69 ---------------------------------------------------------------------- Biometry  BPD:  94.1  mm     G. Age:  38w 2d         82  %    CI:        78.85   %    70 - 86                                                          FL/HC:      19.7   %    20.9 - 22.7  HC:      335.1  mm     G. Age:  38w 2d         40  %    HC/AC:      0.87        0.92 - 1.05  AC:      384.3  mm     G. Age:  42w 3d       > 99  %    FL/BPD:     70.0   %    71 - 87  FL:       65.9  mm     G. Age:  34w 0d        < 1  %    FL/AC:      17.1   %    20 - 24  Est. FW:    3919  gm    8 lb 10 oz      96  % ---------------------------------------------------------------------- OB History  Gravidity:    2         Term:   1  Living:       1 ---------------------------------------------------------------------- Gestational Age  LMP:           37w 6d        Date:  06/01/19                 EDD:   03/07/20  U/S Today:     38w 2d                                        EDD:   03/04/20  Best:          37w 6d     Det. By:  LMP  (06/01/19)          EDD:   03/07/20 ---------------------------------------------------------------------- Anatomy  Cranium:               Previously seen        LVOT:                   Previously seen  Cavum:                 Previously seen        Aortic Arch:            Previously seen  Ventricles:            Previously seen        Ductal Arch:            Previously seen  Choroid Plexus:        Previously seen        Diaphragm:  Appears normal  Cerebellum:            Previously seen        Stomach:                Appears normal, left                                                                        sided  Posterior Fossa:       Previously seen        Abdomen:                Previously seen  Nuchal Fold:            Not applicable (>20    Abdominal Wall:         Previously seen                         wks GA)  Face:                  Orbits and profile     Cord Vessels:           Previously seen                         previously seen  Lips:                  Previously seen        Kidneys:                Multicystic                                                                        dysplastic left                                                                        pelvic  Palate:                Not well visualized    Bladder:                Appears normal  Thoracic:              Previously seen        Spine:                  Previously seen  Heart:                 Previously seen        Upper Extremities:      Previously seen  RVOT:  Previously seen        Lower Extremities:      Previously seen  Other:  Heels and 5th digit previously visualized. Nasal bone prev visualized.          Technically difficult due to fetal position. ---------------------------------------------------------------------- Cervix Uterus Adnexa  Cervix  Not visualized (advanced GA >24wks) ---------------------------------------------------------------------- Impression  Left multicystic dysplastic kidney.  Patient return for fetal  growth assessment.  She does not have gestational diabetes.  Amniotic fluid is normal good fetal activity seen.  The  estimated fetal weight is at the 96th percentile.  Cephalic  presentation.  Patient was counseled that ultrasound has  limitations in accurately estimating fetal weights. ---------------------------------------------------------------------- Recommendations  Follow-up scans as clinically indicated. ----------------------------------------------------------------------                  Noralee Space, MD Electronically Signed Final Report   02/21/2020 04:38 pm ----------------------------------------------------------------------  [place] MFM OB FOLLOW UP  Result Date:  01/31/2020 ----------------------------------------------------------------------  OBSTETRICS REPORT                       (Signed Final 01/31/2020 03:45 pm) ---------------------------------------------------------------------- Patient Info  ID #:       [id number]                          D.O.B.:  09/11/1995 (24 yrs)  Name:       Stephanie Simon                     Visit Date: 01/31/2020 03:18 pm ---------------------------------------------------------------------- Performed By  Attending:        Noralee Space MD        Ref. Address:     [address]                                                             [address]  Performed By:     Sandi Mealy        [facility]  Referred By:      Isa Rankin  NEWTON MD ---------------------------------------------------------------------- Orders  #  Description                           Code        Ordered By  1  [place] MFM OB FOLLOW UP                   16109.60    Noralee Space ----------------------------------------------------------------------  #  Order #                     Accession #                Episode #  1  [id number]                   [id number]                 [id number] ---------------------------------------------------------------------- Indications  Encounter for other antenatal screening        Z36.2  follow-up  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)  Obesity complicating pregnancy, third          O99.213  trimester (pregravid BMI 32)  [redacted] weeks gestation of pregnancy                Z3A.34  Maternal care for excessive fetal growth,      O36.63X0  third trimester, fetus unspecified ---------------------------------------------------------------------- Vital Signs                                                  Height:        5'2" ---------------------------------------------------------------------- Fetal Evaluation  Num Of Fetuses:         1  Fetal Heart Rate(bpm):  146  Cardiac Activity:       Observed  Presentation:           Cephalic  Placenta:               Posterior  P. Cord Insertion:      Visualized  Amniotic Fluid  AFI FV:      Within normal limits  AFI Sum(cm)     %Tile       Largest Pocket(cm)  21.94           83          6.69  RUQ(cm)       RLQ(cm)       LUQ(cm)        LLQ(cm)  5.6           4.38          5.27           6.69 ---------------------------------------------------------------------- Biometry  BPD:      88.7  mm     G. Age:  35w 6d         79  %    CI:        77.31   %    70 - 86  FL/HC:      20.3   %    20.1 - 22.3  HC:      319.4  mm     G. Age:  36w 0d         43  %    HC/AC:      0.91        0.93 - 1.11  AC:      352.1  mm     G. Age:  39w 1d       > 99  %    FL/BPD:     73.1   %    71 - 87  FL:       64.8  mm     G. Age:  33w 3d         11  %    FL/AC:      18.4   %    20 - 24  HUM:        57  mm     G. Age:  33w 1d         29  %  Est. FW:    3121  gm    6 lb 14 oz      96  % ---------------------------------------------------------------------- OB History  Gravidity:    2         Term:   1  Living:       1 ---------------------------------------------------------------------- Gestational Age  LMP:           34w 6d        Date:  06/01/19                 EDD:   03/07/20  U/S Today:     36w 1d                                        EDD:   02/27/20  Best:          34w 6d     Det. By:  LMP  (06/01/19)          EDD:   03/07/20 ---------------------------------------------------------------------- Anatomy  Cranium:               Appears normal         Aortic Arch:            Previously seen  Cavum:                 Appears normal         Ductal Arch:            Previously seen  Ventricles:            Previously seen         Diaphragm:              Appears normal  Choroid Plexus:        Previously seen        Stomach:                Appears normal, left  sided  Cerebellum:            Previously seen        Abdomen:                Previously seen  Posterior Fossa:       Previously seen        Abdominal Wall:         Previously seen  Nuchal Fold:           Not applicable (>20    Cord Vessels:           Previously seen                         wks GA)  Face:                  Orbits and profile     Kidneys:                Multicystic                         previously seen                                                                        dysplastic left                                                                        pelvic  Lips:                  Previously seen        Bladder:                Appears normal  Thoracic:              Appears normal         Spine:                  Previously seen  Heart:                 Previously seen        Upper Extremities:      Previously seen  RVOT:                  Previously seen        Lower Extremities:      Previously seen  LVOT:                  Previously seen  Other:  Heels and 5th digit previously visualized. Nasal bone visualized.          Technically difficult due to fetal position. ---------------------------------------------------------------------- Impression  Multicystic multicystic left dysplastic kidney.  Patient does not have gestational diabetes.  Amniotic fluid is normal and good fetal activity is seen .  The  estimated fetal weight is at the 96 percentile.  Multicystic  dysplastic left kidney  seen again.  Right kidney appears  normal.  I reassured the patient of the findings. ---------------------------------------------------------------------- Recommendations  -An appointment was made for her to return in 3 weeks for  fetal growth and renal assessments.  ----------------------------------------------------------------------                  Noralee Space, MD Electronically Signed Final Report   01/31/2020 03:45 pm ----------------------------------------------------------------------   Assessment and Plan:  Pregnancy: G2P1001 at [redacted]w[redacted]d 1. Large for dates affecting management of mother, third trimester, not applicable or unspecified fetus 2. History of macrosomia (9 lbs) in infant in prior pregnancy, currently pregnant 02/21/20 EFW 8 -10/ 96%, AC >99%.  Pelvis already tested to 9 lb.  Offered IOL at 39 weeks given multiparity and favorable cervix, she declined. Desires IOL at 41 to maximize chances of spontaneous labor. IOL scheduled at 41 weeks, orders signed and held.  3. Fetal left pelvic kidney To be followed up postpartum by pediatricians  4. [redacted] weeks gestation of pregnancy 5. Supervision of other normal pregnancy, antepartum Postdates testing to start next week.  Labor symptoms and general obstetric precautions including but not limited to vaginal bleeding, contractions, leaking of fluid and fetal movement were reviewed in detail with the patient. Please refer to After Visit Summary for other counseling recommendations.   Return in about 11 days (around 03/12/2020) for NST, AFI, OFFICE OB VISIT (MD only).    Jaynie Collins, MD

## 2020-03-01 NOTE — Telephone Encounter (Signed)
Preadmission screen  

## 2020-03-05 ENCOUNTER — Encounter: Payer: Medicaid Other | Admitting: Obstetrics and Gynecology

## 2020-03-10 ENCOUNTER — Other Ambulatory Visit: Payer: Self-pay | Admitting: Advanced Practice Midwife

## 2020-03-11 ENCOUNTER — Other Ambulatory Visit: Payer: Self-pay

## 2020-03-11 ENCOUNTER — Inpatient Hospital Stay (HOSPITAL_COMMUNITY): Payer: Medicaid Other | Admitting: Anesthesiology

## 2020-03-11 ENCOUNTER — Inpatient Hospital Stay (HOSPITAL_COMMUNITY)
Admission: AD | Admit: 2020-03-11 | Discharge: 2020-03-12 | DRG: 807 | Disposition: A | Discharge status: Home / Self Care | Payer: Medicaid Other | Attending: Family Medicine | Admitting: Family Medicine

## 2020-03-11 ENCOUNTER — Encounter (HOSPITAL_COMMUNITY): Payer: Self-pay | Admitting: Obstetrics & Gynecology

## 2020-03-11 DIAGNOSIS — Z3A40 40 weeks gestation of pregnancy: Secondary | ICD-10-CM

## 2020-03-11 DIAGNOSIS — O3663X0 Maternal care for excessive fetal growth, third trimester, not applicable or unspecified: Secondary | ICD-10-CM | POA: Diagnosis present

## 2020-03-11 DIAGNOSIS — O358XX0 Maternal care for other (suspected) fetal abnormality and damage, not applicable or unspecified: Secondary | ICD-10-CM | POA: Diagnosis present

## 2020-03-11 DIAGNOSIS — O35EXX0 Maternal care for other (suspected) fetal abnormality and damage, fetal genitourinary anomalies, not applicable or unspecified: Secondary | ICD-10-CM

## 2020-03-11 DIAGNOSIS — O26893 Other specified pregnancy related conditions, third trimester: Secondary | ICD-10-CM | POA: Diagnosis present

## 2020-03-11 DIAGNOSIS — O480 Post-term pregnancy: Principal | ICD-10-CM | POA: Diagnosis present

## 2020-03-11 DIAGNOSIS — F419 Anxiety disorder, unspecified: Secondary | ICD-10-CM | POA: Diagnosis present

## 2020-03-11 DIAGNOSIS — O99214 Obesity complicating childbirth: Secondary | ICD-10-CM | POA: Diagnosis present

## 2020-03-11 DIAGNOSIS — E669 Obesity, unspecified: Secondary | ICD-10-CM | POA: Diagnosis present

## 2020-03-11 DIAGNOSIS — Z30430 Encounter for insertion of intrauterine contraceptive device: Secondary | ICD-10-CM

## 2020-03-11 DIAGNOSIS — Z20822 Contact with and (suspected) exposure to covid-19: Secondary | ICD-10-CM | POA: Diagnosis present

## 2020-03-11 DIAGNOSIS — F32A Depression, unspecified: Secondary | ICD-10-CM | POA: Diagnosis present

## 2020-03-11 DIAGNOSIS — Z975 Presence of (intrauterine) contraceptive device: Secondary | ICD-10-CM | POA: Insufficient documentation

## 2020-03-11 DIAGNOSIS — O4202 Full-term premature rupture of membranes, onset of labor within 24 hours of rupture: Secondary | ICD-10-CM | POA: Diagnosis not present

## 2020-03-11 LAB — RESP PANEL BY RT-PCR (FLU A&B, COVID) ARPGX2
Influenza A by PCR: NEGATIVE
Influenza B by PCR: NEGATIVE
SARS Coronavirus 2 by RT PCR: NEGATIVE

## 2020-03-11 LAB — CBC
HCT: 33.7 % — ABNORMAL LOW (ref 36.0–46.0)
Hemoglobin: 9.7 g/dL — ABNORMAL LOW (ref 12.0–15.0)
MCH: 19.8 pg — ABNORMAL LOW (ref 26.0–34.0)
MCHC: 28.8 g/dL — ABNORMAL LOW (ref 30.0–36.0)
MCV: 68.6 fL — ABNORMAL LOW (ref 80.0–100.0)
Platelets: 327 10*3/uL (ref 150–400)
RBC: 4.91 MIL/uL (ref 3.87–5.11)
RDW: 19.5 % — ABNORMAL HIGH (ref 11.5–15.5)
WBC: 16.1 10*3/uL — ABNORMAL HIGH (ref 4.0–10.5)
nRBC: 0 % (ref 0.0–0.2)

## 2020-03-11 LAB — COMPREHENSIVE METABOLIC PANEL
ALT: 20 U/L (ref 0–44)
AST: 22 U/L (ref 15–41)
Albumin: 2.7 g/dL — ABNORMAL LOW (ref 3.5–5.0)
Alkaline Phosphatase: 184 U/L — ABNORMAL HIGH (ref 38–126)
Anion gap: 12 (ref 5–15)
BUN: 10 mg/dL (ref 6–20)
CO2: 20 mmol/L — ABNORMAL LOW (ref 22–32)
Calcium: 9 mg/dL (ref 8.9–10.3)
Chloride: 104 mmol/L (ref 98–111)
Creatinine, Ser: 0.66 mg/dL (ref 0.44–1.00)
GFR, Estimated: >60 mL/min (ref >60)
Glucose, Bld: 93 mg/dL (ref 70–99)
Potassium: 4 mmol/L (ref 3.5–5.1)
Sodium: 136 mmol/L (ref 135–145)
Total Bilirubin: 0.4 mg/dL (ref 0.3–1.2)
Total Protein: 6.8 g/dL (ref 6.5–8.1)

## 2020-03-11 LAB — POCT FERN TEST: POCT Fern Test: POSITIVE — AB

## 2020-03-11 LAB — RPR: RPR Ser Ql: NONREACTIVE

## 2020-03-11 LAB — TYPE AND SCREEN
ABO/RH(D): A POS
Antibody Screen: NEGATIVE

## 2020-03-11 MED ORDER — OXYCODONE-ACETAMINOPHEN 5-325 MG PO TABS: 2.0000 | ORAL_TABLET | ORAL | Status: DC | PRN

## 2020-03-11 MED ORDER — FENTANYL CITRATE (PF) 100 MCG/2ML IJ SOLN
INTRAMUSCULAR | Status: DC | PRN
Start: 2020-03-11 — End: 2020-03-11
  Administered 2020-03-11: 25 ug via INTRATHECAL

## 2020-03-11 MED ORDER — FENTANYL-BUPIVACAINE-NACL 0.5-0.125-0.9 MG/250ML-% EP SOLN
12.0000 mL/h | EPIDURAL | Status: DC | PRN
  Filled 2020-03-11: qty 250

## 2020-03-11 MED ORDER — SOD CITRATE-CITRIC ACID 500-334 MG/5ML PO SOLN: 30.0000 mL | ORAL | Status: DC | PRN

## 2020-03-11 MED ORDER — LACTATED RINGERS IV SOLN: 500.0000 mL | INTRAVENOUS | Status: DC | PRN

## 2020-03-11 MED ORDER — DIBUCAINE (PERIANAL) 1 % EX OINT: 1.0000 "application " | TOPICAL_OINTMENT | CUTANEOUS | Status: DC | PRN

## 2020-03-11 MED ORDER — PHENYLEPHRINE 40 MCG/ML (10ML) SYRINGE FOR IV PUSH (FOR BLOOD PRESSURE SUPPORT): 80.0000 ug | PREFILLED_SYRINGE | INTRAVENOUS | Status: DC | PRN

## 2020-03-11 MED ORDER — SIMETHICONE 80 MG PO CHEW: 80.0000 mg | CHEWABLE_TABLET | ORAL | Status: DC | PRN

## 2020-03-11 MED ORDER — OXYTOCIN-SODIUM CHLORIDE 30-0.9 UT/500ML-% IV SOLN: 2.5000 [IU]/h | INTRAVENOUS | Status: DC

## 2020-03-11 MED ORDER — OXYTOCIN BOLUS FROM INFUSION: 333.0000 mL | Freq: Once | INTRAVENOUS | Status: DC

## 2020-03-11 MED ORDER — LACTATED RINGERS IV SOLN: INTRAVENOUS | Status: DC

## 2020-03-11 MED ORDER — HYDROXYZINE HCL 50 MG PO TABS
50.0000 mg | ORAL_TABLET | Freq: Four times a day (QID) | ORAL | Status: DC | PRN
  Filled 2020-03-11: qty 1

## 2020-03-11 MED ORDER — PRENATAL MULTIVITAMIN CH
1.0000 | ORAL_TABLET | Freq: Every day | ORAL | Status: DC
  Administered 2020-03-11 – 2020-03-12 (×2): 1 via ORAL
  Filled 2020-03-11 (×2): qty 1

## 2020-03-11 MED ORDER — BENZOCAINE-MENTHOL 20-0.5 % EX AERO: 1.0000 "application " | INHALATION_SPRAY | CUTANEOUS | Status: DC | PRN

## 2020-03-11 MED ORDER — BUPIVACAINE HCL 0.25 % IJ SOLN
INTRAMUSCULAR | Status: DC | PRN
  Administered 2020-03-11: 1 mL

## 2020-03-11 MED ORDER — LIDOCAINE HCL (PF) 1 % IJ SOLN: 30.0000 mL | INTRAMUSCULAR | Status: DC | PRN

## 2020-03-11 MED ORDER — ONDANSETRON HCL 4 MG/2ML IJ SOLN: 4.0000 mg | Freq: Four times a day (QID) | INTRAMUSCULAR | Status: DC | PRN

## 2020-03-11 MED ORDER — SENNOSIDES-DOCUSATE SODIUM 8.6-50 MG PO TABS
2.0000 | ORAL_TABLET | ORAL | Status: DC
  Administered 2020-03-11: 2 via ORAL
  Filled 2020-03-11: qty 2

## 2020-03-11 MED ORDER — ONDANSETRON HCL 4 MG PO TABS: 4.0000 mg | ORAL_TABLET | ORAL | Status: DC | PRN

## 2020-03-11 MED ORDER — OXYTOCIN-SODIUM CHLORIDE 30-0.9 UT/500ML-% IV SOLN
2.5000 [IU]/h | INTRAVENOUS | Status: DC
  Filled 2020-03-11: qty 500

## 2020-03-11 MED ORDER — FLEET ENEMA 7-19 GM/118ML RE ENEM: 1.0000 | ENEMA | Freq: Every day | RECTAL | Status: DC | PRN

## 2020-03-11 MED ORDER — EPHEDRINE 5 MG/ML INJ: 10.0000 mg | INTRAVENOUS | Status: DC | PRN

## 2020-03-11 MED ORDER — DIPHENHYDRAMINE HCL 25 MG PO CAPS: 25.0000 mg | ORAL_CAPSULE | Freq: Four times a day (QID) | ORAL | Status: DC | PRN

## 2020-03-11 MED ORDER — DIPHENHYDRAMINE HCL 50 MG/ML IJ SOLN: 12.5000 mg | INTRAMUSCULAR | Status: DC | PRN

## 2020-03-11 MED ORDER — OXYCODONE-ACETAMINOPHEN 5-325 MG PO TABS: 1.0000 | ORAL_TABLET | ORAL | Status: DC | PRN

## 2020-03-11 MED ORDER — ACETAMINOPHEN 325 MG PO TABS: 650.0000 mg | ORAL_TABLET | ORAL | Status: DC | PRN

## 2020-03-11 MED ORDER — FENTANYL-BUPIVACAINE-NACL 0.5-0.125-0.9 MG/250ML-% EP SOLN: 2.0000 mL/h | EPIDURAL | Status: DC | PRN

## 2020-03-11 MED ORDER — TETANUS-DIPHTH-ACELL PERTUSSIS 5-2.5-18.5 LF-MCG/0.5 IM SUSY: 0.5000 mL | PREFILLED_SYRINGE | Freq: Once | INTRAMUSCULAR | Status: DC

## 2020-03-11 MED ORDER — ONDANSETRON HCL 4 MG/2ML IJ SOLN: 4.0000 mg | INTRAMUSCULAR | Status: DC | PRN

## 2020-03-11 MED ORDER — PARAGARD INTRAUTERINE COPPER IU IUD
INTRAUTERINE_SYSTEM | Freq: Once | INTRAUTERINE | Status: AC
  Administered 2020-03-11: 1 via INTRAUTERINE

## 2020-03-11 MED ORDER — PARAGARD INTRAUTERINE COPPER IU IUD
INTRAUTERINE_SYSTEM | INTRAUTERINE | Status: AC
  Filled 2020-03-11: qty 1

## 2020-03-11 MED ORDER — COCONUT OIL OIL: 1.0000 "application " | TOPICAL_OIL | Status: DC | PRN

## 2020-03-11 MED ORDER — OXYTOCIN BOLUS FROM INFUSION
333.0000 mL | Freq: Once | INTRAVENOUS | Status: AC
  Administered 2020-03-11: 333 mL via INTRAVENOUS

## 2020-03-11 MED ORDER — IBUPROFEN 600 MG PO TABS
600.0000 mg | ORAL_TABLET | Freq: Four times a day (QID) | ORAL | Status: DC
  Administered 2020-03-11 – 2020-03-12 (×5): 600 mg via ORAL
  Filled 2020-03-11 (×5): qty 1

## 2020-03-11 MED ORDER — FENTANYL CITRATE (PF) 100 MCG/2ML IJ SOLN: 50.0000 ug | INTRAMUSCULAR | Status: DC | PRN

## 2020-03-11 MED ORDER — LACTATED RINGERS IV SOLN: 500.0000 mL | Freq: Once | INTRAVENOUS | Status: DC

## 2020-03-11 MED ORDER — FENTANYL CITRATE (PF) 100 MCG/2ML IJ SOLN
50.0000 ug | INTRAMUSCULAR | Status: DC | PRN
  Filled 2020-03-11: qty 2

## 2020-03-11 MED ORDER — WITCH HAZEL-GLYCERIN EX PADS: 1.0000 "application " | MEDICATED_PAD | CUTANEOUS | Status: DC | PRN

## 2020-03-11 NOTE — Discharge Summary (Addendum)
Postpartum Discharge Summary    Patient Name: Stephanie Simon DOB: [birth year] MRN: [id number]  Date of admission: 03/11/2020 Delivery date:03/11/2020  Delivering provider: Gerlene Fee  Date of discharge: 03/12/2020  Admitting diagnosis: Post-dates pregnancy [O48.0] Normal labor [O80, Z37.9] Intrauterine pregnancy: [redacted]w[redacted]d    Secondary diagnosis:  Active Problems:   Anxiety and depression   Fetal left pelvic kidney   Post-dates pregnancy   Normal labor  Additional problems: none    Discharge diagnosis: Term Pregnancy Delivered & postplacental Paragard                  Post partum procedures: none Augmentation:  none Complications: None  Hospital course: Onset of Labor With Vaginal Delivery      24y.o. yo G2P1001 at 422w4das admitted in Active Labor on 03/11/2020. Patient had an uncomplicated labor course as follows:  Membrane Rupture Time/Date: 5:10 AM ,03/11/2020   Delivery Method:Vaginal, Spontaneous  Episiotomy: None  Lacerations:    Patient had an uncomplicated postpartum course.  She is ambulating, tolerating a regular diet, passing flatus, and urinating well. Patient is discharged home in stable condition on 03/12/20.  Newborn Data: Birth date:03/11/2020  Birth time:8:31 AM  Gender:Female  Living status:Living  Apgars:8 ,9  Weight:4459 g   Magnesium Sulfate received: No BMZ received: No Rhophylac:N/A MMR:N/A T-DaP:Given prenatally Flu: given prenatally Transfusion:No  Physical exam  Vitals:   03/11/20 1748 03/11/20 2201 03/12/20 0511 03/12/20 1357  BP: 119/73 124/72 126/79 116/71  Pulse: 73 73 66 79  Resp: 17 18 18 19   Temp: 98 F (36.7 C) 98.6 F (37 C) 98 F (36.7 C) 98 F (36.7 C)  TempSrc:   Oral Oral  SpO2: 100% 98% 100% 99%  Weight:      Height:       General: alert and no distress Lochia: appropriate Uterine Fundus: firm Incision: N/A DVT Evaluation: No evidence of DVT seen on physical  exam. Labs: Lab Results  Component Value Date   WBC 16.1 (H) 03/11/2020   HGB 9.7 (L) 03/11/2020   HCT 33.7 (L) 03/11/2020   MCV 68.6 (L) 03/11/2020   PLT 327 03/11/2020   CMP Latest Ref Rng & Units 03/11/2020  Glucose 70 - 99 mg/dL 93  BUN 6 - 20 mg/dL 10  Creatinine 0.44 - 1.00 mg/dL 0.66  Sodium 135 - 145 mmol/L 136  Potassium 3.5 - 5.1 mmol/L 4.0  Chloride 98 - 111 mmol/L 104  CO2 22 - 32 mmol/L 20(L)  Calcium 8.9 - 10.3 mg/dL 9.0  Total Protein 6.5 - 8.1 g/dL 6.8  Total Bilirubin 0.3 - 1.2 mg/dL 0.4  Alkaline Phos 38 - 126 U/L 184(H)  AST 15 - 41 U/L 22  ALT 0 - 44 U/L 20   Edinburgh Score: Edinburgh Postnatal Depression Scale Screening Tool 03/11/2020  I have been able to laugh and see the funny side of things. (No Data)  I have looked forward with enjoyment to things. -  I have blamed myself unnecessarily when things went wrong. -  I have been anxious or worried for no good reason. -  I have felt scared or panicky for no good reason. -  Things have been getting on top of me. -  I have been so unhappy that I have had difficulty sleeping. -  I have felt sad or miserable. -  I have been so unhappy that I have been crying. -  The thought of harming myself has occurred to me. - Flavia Shipper  Postnatal Depression Scale Total -     After visit meds:  Allergies as of 03/12/2020   No Known Allergies      Medication List     TAKE these medications    PRENATAL VITAMIN PO Take by mouth.       Discharge home in stable condition Infant Feeding: Breast Infant Disposition:home with mother Discharge instruction: per After Visit Summary and Postpartum booklet. Activity: Advance as tolerated. Pelvic rest for 6 weeks.  Diet: routine diet Future Appointments: Future Appointments  Date Time Provider [place]  04/10/2020  1:15 PM Anyanwu, Sallyanne Havers, MD [facility]   Follow up Visit:  Roma Schanz, [place] Support Pool Please  schedule this patient for PP visit in: 4 weeks  Low risk pregnancy complicated by: none  Delivery mode:  SVD  Anticipated Birth Control:  Paragard IUD placed postplacental  PP Procedures needed: none  Schedule Integrated [place] visit: no  Provider: Any provider   03/12/2020 Layla Barter, MD  Attestation of Supervision of Student:  I confirm that I have verified the information documented in the  resident 's note and that I have also personally reperformed the history, physical exam and all medical decision making activities.  I have verified that all services and findings are accurately documented in this student's note; and I agree with management and plan as outlined in the documentation. I have also made any necessary editorial changes.   Laury Deep, [place] for [employer], [employer] 03/12/2020 4:40 PM

## 2020-03-11 NOTE — MAU Note (Signed)
Reports CTX that are close, hasn't timed them yet.  Reports SROM of clear fluid just after 0500.  Some bloody show.  Endorses + FM.

## 2020-03-11 NOTE — H&P (Addendum)
OBSTETRIC ADMISSION HISTORY AND PHYSICAL  Stephanie Simon is a 24 y.o. female G2P1001 with IUP at [redacted]w[redacted]d by LMP presenting for SOL. She reports +FMs, No LOF, no VB, no blurry vision, headaches or peripheral edema, and RUQ pain.  She plans on breast feeding. She request IUD-paragard for birth control. She received her prenatal care at [facility]   Dating: By LMP --->  Estimated Date of Delivery: 03/07/20  Sono:    @[redacted]w[redacted]d , CWD, normal anatomy, cephalic presentation, 3919g, EFW   Prenatal History/Complications:  -[redacted]w[redacted]d post dates pregnancy -GBS negative -anxiety -depression -obesity -LGA  Past Medical History: Past Medical History:  Diagnosis Date  . Cardiac murmur 03/28/2013   ECHO 05/13/2013 NORMAL ECHO.  Benign Flow murmur. Heard best in the aortic space and LEFT carotid.  Increases with increased pressure.   . Menorrhagia with regular cycle 09/14/2018    Past Surgical History: Past Surgical History:  Procedure Laterality Date  . NO PAST SURGERIES    . WISDOM TOOTH EXTRACTION  06/2015    Obstetrical History: OB History    Gravida  2   Para  1   Term  1   Preterm      AB      Living  1     SAB      TAB      Ectopic      Multiple  0   Live Births  1           Social History Social History   Socioeconomic History  . Marital status: Single    Spouse name: n/a  . Number of children: 0  . Years of education: Not on file  . Highest education level: Not on file  Occupational History  . Occupation: student    Comment: [employer] 07/2015  Tobacco Use  . Smoking status: Never Smoker  . Smokeless tobacco: Never Used  Vaping Use  . Vaping Use: Never used  Substance and Sexual Activity  . Alcohol use: No  . Drug use: No  . Sexual activity: Yes    Partners: Male    Birth control/protection: None  Other Topics Concern  . Not on file  Social History Narrative   Lives with her parents.  She has 7 half-sisters and one half-brother.   Social  Determinants of Health   Financial Resource Strain:   . Difficulty of Paying Living Expenses: Not on file  Food Insecurity:   . Worried About [employer] in the Last Year: Not on file  . Ran Out of Food in the Last Year: Not on file  Transportation Needs:   . Lack of Transportation (Medical): Not on file  . Lack of Transportation (Non-Medical): Not on file  Physical Activity:   . Days of Exercise per Week: Not on file  . Minutes of Exercise per Session: Not on file  Stress:   . Feeling of Stress : Not on file  Social Connections:   . Frequency of Communication with Friends and Family: Not on file  . Frequency of Social Gatherings with Friends and Family: Not on file  . Attends Religious Services: Not on file  . Active Member of Clubs or Organizations: Not on file  . Attends Programme researcher, broadcasting/film/video Meetings: Not on file  . Marital Status: Not on file    Family History: Family History  Problem Relation Age of Onset  . Cancer Paternal Aunt   . Anemia Mother   . Diabetes Father   .  Arthritis Father        gout  . Anemia Father   . Hypertension Father   . Heart disease Maternal Grandfather     Allergies: No Known Allergies  Medications Prior to Admission  Medication Sig Dispense Refill Last Dose  . Prenatal Vit-Fe Fumarate-FA (PRENATAL VITAMIN PO) Take by mouth.        Review of Systems   All systems reviewed and negative except as stated in HPI  Blood pressure (!) 144/80, pulse 79, temperature 98.2 F (36.8 C), weight 99 kg, last menstrual period 06/01/2019, SpO2 100 %, unknown if currently breastfeeding. General appearance: alert, cooperative and no distress Chest: normal respiratory effort Abdomen: soft, non-tender; gravid Extremities: no LE edema noted bilaterally, no sign of DVT Presentation: cephalic Fetal monitoringBaseline: 140 bpm, Variability: Good {> 6 bpm), Accelerations: Reactive and Decelerations: Variable: mild Uterine activity: intermittent  contractions  Dilation: 7 Effacement (%): 90 Station: 0 Exam by:: A. Day, RN   Prenatal labs: ABO, Rh: --/--/PENDING (11/28 0539) Antibody: PENDING (11/28 7673) Rubella: 1.97 (05/03 1408) RPR: Non Reactive (09/02 0937)  HBsAg: Negative (05/03 1408)  HIV: Non Reactive (09/02 0939)  GBS: Negative/-- (11/01 1538)  1 hr Glucola: 118 (12/15/2019) Genetic screening: normal Anatomy US: left multicystic dysplastic kidney  Prenatal Transfer Tool  Maternal Diabetes: No Genetic Screening: Normal Maternal Ultrasounds/Referrals: Fetal Kidney Anomalies (L pelvic kidney) Fetal Ultrasounds or other Referrals:  Referred to Materal Fetal Medicine  Maternal Substance Abuse:  No Significant Maternal Medications:  None Significant Maternal Lab Results: Group B Strep negative  Results for orders placed or performed during the hospital encounter of 03/11/20 (from the past 24 hour(s))  POCT fern test   Collection Time: 03/11/20  6:29 AM  Result Value Ref Range   POCT Fern Test Positive = ruptured amniotic membanes (A)   CBC   Collection Time: 03/11/20  6:32 AM  Result Value Ref Range   WBC 16.1 (H) 4.0 - 10.5 K/uL   RBC 4.91 3.87 - 5.11 MIL/uL   Hemoglobin 9.7 (L) 12.0 - 15.0 g/dL   HCT 41.9 (L) 36 - 46 %   MCV 68.6 (L) 80.0 - 100.0 fL   MCH 19.8 (L) 26.0 - 34.0 pg   MCHC 28.8 (L) 30.0 - 36.0 g/dL   RDW 37.9 (H) 02.4 - 09.7 %   Platelets 327 150 - 400 K/uL   nRBC 0.0 0.0 - 0.2 %  Type and screen   Collection Time: 03/11/20  6:32 AM  Result Value Ref Range   ABO/RH(D) PENDING    Antibody Screen PENDING    Sample Expiration      03/14/2020,2359 Performed at [facility], [address], [place], [place] [address]     Patient Active Problem List   Diagnosis Date Noted  . Post-dates pregnancy 03/11/2020  . Normal labor 03/11/2020  . 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 Supervision of other normal pregnancy, antepartum 08/15/2019  . BMI 30s 08/15/2019  . Obesity in pregnancy  08/15/2019  . Anxiety and depression 08/15/2019    Assessment/Plan:  Stephanie Simon is a 20 y.o. G2P1001 at [redacted]w[redacted]d here for SOL in the setting of post dates pregnancy.   #Labor: Progressing well, continue to monitor. #Pain: fentanyl #FWB: Category 2 given presence of occasional variable decels but overall reassuring  #ID: GBS negative  #MOF: breast #MOC: IUD-paragard #Circ: n/a (girl) #Anxiety and depression: stable, no home meds, consider SW consult   Reece Leader, DO  03/11/2020, 7:16  AM   CNM attestation:  I have seen and examined this patient; I agree with above documentation in the resident's note.   Stephanie Simon is a 44 y.o. G2P1001 here for active labor.  PE: BP 125/60   Pulse 65   Temp 98.6 F (37 C) (Oral)   Resp 18   Ht 5\' 2"  (1.575 m)   Wt 99 kg   LMP 06/01/2019 (Exact Date)   SpO2 99%   BMI 39.93 kg/m  Resp: normal effort, no distress Abd: gravid  ROS, labs, PMH reviewed  Plan: Admit to Labor and Delivery Expectant management Will try to place epidural Be prepared for SD given hx of SD in the past with LGA @ 96th% this preg Plan on postplacental IUD placement  06/03/2019 CNM 03/11/2020, 8:40 AM

## 2020-03-11 NOTE — Anesthesia Preprocedure Evaluation (Signed)
Anesthesia Evaluation  Patient identified by MRN, date of birth, ID band Patient awake    Reviewed: Allergy & Precautions, NPO status , Patient's Chart, lab work & pertinent test results  Airway Mallampati: II       Dental   Pulmonary    Pulmonary exam normal        Cardiovascular negative cardio ROS Normal cardiovascular exam     Neuro/Psych PSYCHIATRIC DISORDERS Anxiety Depression    GI/Hepatic Neg liver ROS,   Endo/Other    Renal/GU      Musculoskeletal   Abdominal   Peds  Hematology   Anesthesia Other Findings   Reproductive/Obstetrics (+) Pregnancy                             Anesthesia Physical Anesthesia Plan  ASA: II  Anesthesia Plan: Combined Spinal and Epidural   Post-op Pain Management:    Induction:   PONV Risk Score and Plan: 0  Airway Management Planned: Natural Airway  Additional Equipment: None  Intra-op Plan:   Post-operative Plan:   Informed Consent: I have reviewed the patients History and Physical, chart, labs and discussed the procedure including the risks, benefits and alternatives for the proposed anesthesia with the patient or authorized representative who has indicated his/her understanding and acceptance.       Plan Discussed with:   Anesthesia Plan Comments: (Lab Results      Component                Value               Date                      WBC                      16.1 (H)            03/11/2020                HGB                      9.7 (L)             03/11/2020                HCT                      33.7 (L)            03/11/2020                MCV                      68.6 (L)            03/11/2020                PLT                      327                 03/11/2020           )        Anesthesia Quick Evaluation

## 2020-03-11 NOTE — Anesthesia Procedure Notes (Signed)
Epidural Patient location during procedure: OB Start time: 03/11/2020 7:21 AM End time: 03/11/2020 7:26 AM  Staffing Anesthesiologist: Shelton Silvas, MD Performed: anesthesiologist   Preanesthetic Checklist Completed: patient identified, IV checked, site marked, risks and benefits discussed, surgical consent, monitors and equipment checked, pre-op evaluation and timeout performed  Epidural Patient position: sitting Prep: DuraPrep Patient monitoring: heart rate, continuous pulse ox and blood pressure Approach: midline Location: L3-L4 Injection technique: LOR saline  Needle:  Needle type: Tuohy  Needle gauge: 17 G Needle length: 9 cm Catheter type: closed end flexible Catheter size: 20 Guage Test dose: negative and 1.5% lidocaine  Assessment Events: blood not aspirated, injection not painful, no injection resistance and no paresthesia  Additional Notes LOR @ 5  CSE - spinal needle introduced, CSF obtained and aspirated x2, Fentanyl administered, 1 cc 0.25 Bupi    Patient identified. Risks/Benefits/Options discussed with patient including but not limited to bleeding, infection, nerve damage, paralysis, failed block, incomplete pain control, headache, blood pressure changes, nausea, vomiting, reactions to medications, itching and postpartum back pain. Confirmed with bedside nurse the patient's most recent platelet count. Confirmed with patient that they are not currently taking any anticoagulation, have any bleeding history or any family history of bleeding disorders. Patient expressed understanding and wished to proceed. All questions were answered. Sterile technique was used throughout the entire procedure. Please see nursing notes for vital signs. Test dose was given through epidural catheter and negative prior to continuing to dose epidural or start infusion. Warning signs of high block given to the patient including shortness of breath, tingling/numbness in hands,  complete motor block, or any concerning symptoms with instructions to call for help. Patient was given instructions on fall risk and not to get out of bed. All questions and concerns addressed with instructions to call with any issues or inadequate analgesia.    Reason for block:procedure for pain

## 2020-03-11 NOTE — Anesthesia Postprocedure Evaluation (Signed)
Anesthesia Post Note  Patient: Stephanie Simon  Procedure(s) Performed: AN AD HOC LABOR EPIDURAL     Patient location during evaluation: Mother Baby Anesthesia Type: Epidural Level of consciousness: awake and alert, oriented and patient cooperative Pain management: pain level controlled Vital Signs Assessment: post-procedure vital signs reviewed and stable Respiratory status: spontaneous breathing Cardiovascular status: stable Postop Assessment: no headache, epidural receding, patient able to bend at knees and no signs of nausea or vomiting Anesthetic complications: no Comments: Pt. States she is walking.  Pain score 0.    No complications documented.  Last Vitals:  Vitals:   03/11/20 1034 03/11/20 1133  BP: 116/70 132/78  Pulse: 72 74  Resp: 17 17  Temp: 36.8 C 36.9 C  SpO2: 99% 100%    Last Pain:  Vitals:   03/11/20 1133  TempSrc: Oral  PainSc:    Pain Goal:                   [facility]

## 2020-03-11 NOTE — Procedures (Signed)
POST-PLACENTAL IUD INSERTION PROCEDURE NOTE Patient name: Stephanie Simon MRN [id number]  Date of birth: [birth year]  The risks and benefits of the method and placement have been thouroughly reviewed with the patient and all questions were answered.  Specifically the patient is aware of failure rate of 04/998, expulsion of the IUD and of possible perforation.  The patient is aware of irregular bleeding due to the method and understands the incidence of irregular bleeding diminishes with time.   Signed copy of informed consent in chart.   Vaginal, labial and perineal areas thoroughly inspected for lacerations. Lacerations: Lt hemostatic vaginal, not repaired   Pt's IUD choice: Paragard  Time out was performed.    IUD removed from insertion device and grasped between sterile gloved fingers. Fundus identified through abdominal wall using non-insertion hand. IUD inserted to fundus with bimanual technique. IUD carefully released at the fundus and insertion hand gently removed from vagina.    Strings not trimmed (already in vagina d/t Paragard). Patient tolerated procedure well.  Patient given post procedure instructions and IUD care card with expiration date.  Patient is asked to keep IUD strings tucked in her vagina until her postpartum follow up visit in 4-6 weeks. Patient advised to abstain from sexual intercourse and pulling on strings before her follow-up visit. Patient verbalized an understanding of the plan of care and agrees.   Pt added to the post-placental IUD insertion list and charges entered.  Cheral Marker CNM, [facility] 03/11/2020 8:58 AM

## 2020-03-11 NOTE — Lactation Note (Signed)
This note was copied from a baby's chart. Lactation Consultation Note  Patient Name: Stephanie Simon Date: 03/11/2020 Reason for consult: Term;Initial assessment;Other (Comment) (LGA)  Visited with mom of 12 hours old FT female, LC order was put when baby was 55 hours old. Mom is a P2 but not very experienced BF. Mom told LC that she tried BF her first baby for only 3 days, she was in the NICU but she was able to provide EBM through exclusively pumping and bottle feeding for 6 months. She has a hand pump at home.  She participated in the [facility] program at the [facility] and she's already familiar with hand expression. When [facility] revised hand expression with mom she was able to easily get colostrum, praised her for her efforts. Offered assistance with latch and mom agreed to have baby STS, she let LC removed blankets.  LC took baby STS to mother's right breast in cross cradle position and he was able to latch after a couple of attempts. Mom required lots of pillows and blankets for support, baby is LGA > 9 lbs. She fed for 15 minutes with a few audible swallows noted, and required little stimulation to continue sucking. Reviewed normal newborn behavior, feeding cues, cluster feeding, size of baby's stomach and lactogenesis II.  Feeding plan:  1. Encouraged mom to feed baby on cues 8-12 times/24 hours or sooner if feeding cues are present 2. Hand expression and spoon feeding were also encouraged  BF brochure, BF resources and feeding diary were reviewed. FOB present at the time of [facility] consultation but he was on his phone. Mom reported all questions and concerns were answered, she's aware of LC OP services and will call PRN.   Maternal Data Formula Feeding for Exclusion: No Has patient been taught Hand Expression?: Yes Does the patient have breastfeeding experience prior to this delivery?: Yes  Feeding Feeding Type: Breast Fed  LATCH Score Latch: Grasps breast easily, tongue down, lips  flanged, rhythmical sucking.  Audible Swallowing: A few with stimulation  Type of Nipple: Everted at rest and after stimulation  Comfort (Breast/Nipple): Soft / non-tender  Hold (Positioning): Assistance needed to correctly position infant at breast and maintain latch.  LATCH Score: 8  Interventions Interventions: Breast feeding basics reviewed;Assisted with latch;Skin to skin;Breast massage;Hand express;Breast compression;Adjust position;Support pillows  Lactation Tools Discussed/Used WIC Program: Yes   Consult Status Consult Status: Follow-up Date: 03/12/20 Follow-up type: In-patient    Samul Mcinroy Venetia Constable 03/11/2020, 8:43 PM

## 2020-03-12 ENCOUNTER — Other Ambulatory Visit (HOSPITAL_COMMUNITY): Payer: Medicaid Other

## 2020-03-12 ENCOUNTER — Encounter: Payer: Medicaid Other | Admitting: Obstetrics & Gynecology

## 2020-03-12 MED ORDER — FERROUS SULFATE 325 (65 FE) MG PO TABS
325.0000 mg | ORAL_TABLET | ORAL | Status: DC
  Administered 2020-03-12: 325 mg via ORAL
  Filled 2020-03-12: qty 1

## 2020-03-12 NOTE — Progress Notes (Signed)
POSTPARTUM PROGRESS NOTE  Post Partum Day 1  Subjective:  Stephanie Simon is a 24 y.o. [id number] s/p SVD at [redacted]w[redacted]d.  No acute events overnight.  Pt denies problems with ambulating, voiding or po intake.  She denies nausea or vomiting.  Pain is well controlled.  She has had flatus. She has not had bowel movement.  Lochia Moderate.   Objective: Blood pressure 126/79, pulse 66, temperature 98 F (36.7 C), temperature source Oral, resp. rate 18, height 5\' 2"  (1.575 m), weight 99 kg, last menstrual period 06/01/2019, SpO2 100 %, unknown if currently breastfeeding.  Physical Exam:  General: alert, cooperative and no distress Chest: no respiratory distress Heart:regular rate, distal pulses intact Abdomen: soft, nontender,  Uterine Fundus: firm, appropriately tender DVT Evaluation: No calf swelling or tenderness Extremities: no edema Skin: warm, dry  Recent Labs    03/11/20 0632  HGB 9.7*  HCT 33.7*    Assessment/Plan: Stephanie Simon is a 66 y.o. 21 s/p SVD at [redacted]w[redacted]d   PPD#1 - Doing well Contraception: unsure - educated and discussed birth control options with patient  Feeding: breast- educated and discussed different latching positions with patient  Dispo: Plan for discharge tomorrow. Iron deficiency anemia: oral iron supplementation initiated today to be taken every other day    LOS: 1 day   [redacted]w[redacted]d, CNM 03/12/2020, 8:20 AM

## 2020-03-12 NOTE — Progress Notes (Signed)
CSW received consult for hx of Anxiety and Depression.  CSW met with MOB to offer support and complete assessment.    CSW congratulated MOB and FOB on the birth of infant. CSW advised MOB of HIPPA policy and asked that FOB leave room. CSW then advised MOB of the CSW's role and the reason for CSW coming to speak with her. MOB expressed that she is unsure of the depression diagnosis, however MOB does report that she has a hx of anxiety. MOB reported that she was diagnosed with anxiety last year in which she was given medication for however MOB reports "I didn't like the way that they made me feel so I stopped takign them last October". CSW understanding of this and offered MOB therapy resources in turn to assist with anxiety however MOB declined as MOB reported no current challenges with anxiety. CSW was advised that MOB has no other mental health hx and expressed no feelings of SI, HI nor is she involved in DV.   CSW inquired from MOB on who her supports are at this time in which MOB expressed "dad, my dad and my mother in law". CSW was advised that MOB has all needed items to care for infant with plan for infant to sleep in craddle once arrived home.  CSW provided education regarding the baby blues period vs. perinatal mood disorders, discussed treatment and gave resources for mental health follow up if concerns arise.  CSW recommends self-evaluation during the postpartum time period using the New Mom Checklist from Postpartum Progress and encouraged MOB to contact a medical professional if symptoms are noted at any time.   CSW provided review of Sudden Infant Death Syndrome (SIDS) precautions.   CSW identifies no further need for intervention and no barriers to discharge at this time.   Glendy Barsanti S. Glennie Bose, MSW, LCSW [facility] [phone]   

## 2020-03-12 NOTE — Lactation Note (Signed)
This note was copied from a baby's chart. Lactation Consultation Note  Patient Name: Stephanie Simon Date: 03/12/2020 Reason for consult: Follow-up assessment  P2 mother whose infant is now 29 hours old.  This is a term baby at 40+4 weeks.  Mother pumped and bottle fed for 6 months with her first child.  Mother had been feeding for approximately 5 minutes in the side lying position prior to my arrival.  Baby had a shallow latch.  Offered to assist in obtaining a deeper latch and mother agreeable.  Baby latched and mother denied pain with latching.  Mother will continue to feed on cue or at least 8-12 times/24 hours.  She will call for latch assistance as needed.  Mother has a manual pump and a DEBP for home use.  Father present.   Maternal Data    Feeding Feeding Type: Breast Fed  LATCH Score Latch: Grasps breast easily, tongue down, lips flanged, rhythmical sucking.  Audible Swallowing: A few with stimulation  Type of Nipple: Everted at rest and after stimulation  Comfort (Breast/Nipple): Soft / non-tender  Hold (Positioning): Assistance needed to correctly position infant at breast and maintain latch.  LATCH Score: 8  Interventions Interventions: Breast feeding basics reviewed;Assisted with latch;Skin to skin;Breast compression;Adjust position;Position options;Support pillows  Lactation Tools Discussed/Used     Consult Status Consult Status: Complete Date: 03/12/20 Follow-up type: Call as needed    Irene Pap Olena Willy 03/12/2020, 12:31 PM

## 2020-03-14 ENCOUNTER — Inpatient Hospital Stay (HOSPITAL_COMMUNITY)
Admission: AD | Admit: 2020-03-14 | Payer: Medicaid Other | Source: Home / Self Care | Admitting: Obstetrics and Gynecology

## 2020-03-14 ENCOUNTER — Inpatient Hospital Stay (HOSPITAL_COMMUNITY): Payer: Medicaid Other

## 2020-04-10 ENCOUNTER — Other Ambulatory Visit: Payer: Self-pay

## 2020-04-10 ENCOUNTER — Encounter: Payer: Self-pay | Admitting: Obstetrics & Gynecology

## 2020-04-10 ENCOUNTER — Ambulatory Visit (INDEPENDENT_AMBULATORY_CARE_PROVIDER_SITE_OTHER): Payer: Medicaid Other | Admitting: Obstetrics & Gynecology

## 2020-04-10 DIAGNOSIS — Z975 Presence of (intrauterine) contraceptive device: Secondary | ICD-10-CM

## 2020-04-10 DIAGNOSIS — Z30431 Encounter for routine checking of intrauterine contraceptive device: Secondary | ICD-10-CM

## 2020-04-10 NOTE — Progress Notes (Signed)
Post Partum Visit Note  Stephanie Simon is a 24 y.o. G52P2002 female who presents for a postpartum visit. She is 4 weeks postpartum following a normal spontaneous vaginal delivery.  I have fully reviewed the prenatal and intrapartum course. The delivery was at [redacted]w[redacted]d gestational weeks.  Anesthesia: epidural. Postpartum course has been uncomplicated. Baby is doing well. Baby is feeding by breast. Bleeding: mild red. Bowel function is normal. Bladder function is normal. Patient is not sexually active. Contraception method is Paragard IUD, placed after delivery of placenta. Postpartum depression screening: negative.   Edinburgh Postnatal Depression Scale - 04/10/20 1336      Edinburgh Postnatal Depression Scale:  In the Past 7 Days   I have been able to laugh and see the funny side of things. 0    I have looked forward with enjoyment to things. 0    I have blamed myself unnecessarily when things went wrong. 0    I have been anxious or worried for no good reason. 0    I have felt scared or panicky for no good reason. 0    Things have been getting on top of me. 0    I have been so unhappy that I have had difficulty sleeping. 0    I have felt sad or miserable. 0    I have been so unhappy that I have been crying. 0    The thought of harming myself has occurred to me. 0    Edinburgh Postnatal Depression Scale Total 0          The following portions of the patient's history were reviewed and updated as appropriate: allergies, current medications, past family history, past medical history, past social history, past surgical history and problem list.  Review of Systems Pertinent items noted in HPI and remainder of comprehensive ROS otherwise negative.    Objective:  BP 131/82   Pulse 65   Wt 183 lb 3.2 oz (83.1 kg)   BMI 33.51 kg/m   General:  alert and no distress   Breasts:  inspection negative, no nipple discharge or bleeding, no masses or nodularity palpable  Lungs: clear to auscultation  bilaterally  Heart:  regular rate and rhythm  Abdomen: soft, non-tender; bowel sounds normal; no masses,  no organomegaly   Vulva:  normal  Vagina: normal vagina, well-healed first degree laceration, bloody discharge seen  Cervix:  no cervical motion tenderness, Paragard IUD strings seen.  Corpus: normal size, contour, position, consistency, mobility, non-tender  Adnexa:  not evaluated  Rectal Exam: Not performed.        Assessment:   Normal postpartum exam.  Plan:   Essential components of care per ACOG recommendations:  1.  Mood and well being: Patient with negative depression screening today. Reviewed local resources for support.  - Patient does not use tobacco or drugs,  2. Infant care and feeding:  -Patient currently breastmilk feeding? Yes.  Reviewed importance of draining breast regularly to support lactation. -Social determinants of health (SDOH) reviewed in EPIC. No concerns.  3. Sexuality, contraception and birth spacing - Patient does not want a pregnancy in the next year.  Desired family size is unknown at this time.  - Paragard IUD in place, normal IUD check today.  Can be in place for up to 10 years.  - Discussed birth spacing of 18 months.  4. Sleep and fatigue -Encouraged family/partner/community support of 4 hrs of uninterrupted sleep to help with mood and fatigue  5. Physical  Recovery  - Discussed patients delivery - Patient had a 1st degree laceration, perineal healing reviewed. Patient expressed understanding - Patient has urinary incontinence? No - Patient is safe to resume physical and sexual activity in two weeks.  6.  Health Maintenance - Last pap smear done 12/28/2018 and was normal.    Jaynie Collins, MD [facility], [facility]

## 2020-11-12 ENCOUNTER — Encounter (HOSPITAL_COMMUNITY): Payer: Self-pay

## 2020-11-12 ENCOUNTER — Other Ambulatory Visit: Payer: Self-pay

## 2020-11-12 ENCOUNTER — Emergency Department (HOSPITAL_COMMUNITY)
Admission: EM | Admit: 2020-11-12 | Discharge: 2020-11-12 | Disposition: A | Discharge status: Home / Self Care | Payer: Medicaid Other | Attending: Emergency Medicine | Admitting: Emergency Medicine

## 2020-11-12 DIAGNOSIS — M799 Soft tissue disorder, unspecified: Secondary | ICD-10-CM | POA: Diagnosis present

## 2020-11-12 NOTE — ED Provider Notes (Signed)
[facility] EMERGENCY DEPARTMENT Provider Note   CSN: [id number] Arrival date & time: 11/12/20  1216     History No chief complaint on file.   Stephanie Simon is a 25 y.o. female with two days of her right tonsil appearing not like normal. She states that she was eating a chicken nugget when this occurred.  She denies any neck pain or change in voice.  Patient is up-to-date on vaccines.  Patient states that she looked in the mirror and saw that her right tonsil looked different than her left tonsil.  She does not complain of a sore throat.  She denies any pain with swallowing.  She denies any difficulty breathing.  Denies any fevers, chills, chest pain, shortness of breath, nausea, vomiting, diarrhea, numbness or weakness of       Past Medical History:  Diagnosis Date   Cardiac murmur 03/28/2013   ECHO 05/13/2013 NORMAL ECHO.  Benign Flow murmur. Heard best in the aortic space and LEFT carotid.  Increases with increased pressure.    Menorrhagia with regular cycle 09/14/2018    Patient Active Problem List   Diagnosis Date Noted   Paragard IUD (intrauterine device) in place since 03/11/2020 03/11/2020   BMI 30s 08/15/2019   Anxiety and depression 08/15/2019    Past Surgical History:  Procedure Laterality Date   NO PAST SURGERIES     WISDOM TOOTH EXTRACTION  06/2015     OB History     Gravida  2   Para  2   Term  2   Preterm      AB      Living  2      SAB      IAB      Ectopic      Multiple  0   Live Births  2           Family History  Problem Relation Age of Onset   Cancer Paternal Aunt    Anemia Mother    Diabetes Father    Arthritis Father        gout   Anemia Father    Hypertension Father    Heart disease Maternal Grandfather     Social History   Tobacco Use   Smoking status: Never   Smokeless tobacco: Never  Vaping Use   Vaping Use: Never used  Substance Use Topics   Alcohol use: No   Drug use: No    Home  Medications Prior to Admission medications   Medication Sig Start Date End Date Taking? Authorizing Provider  Prenatal Vit-Fe Fumarate-FA (PRENATAL VITAMIN PO) Take by mouth.    [provider]    Allergies    Patient has no known allergies.  Review of Systems   Review of Systems  Constitutional:  Negative for chills and fever.  HENT:  Negative for congestion, drooling, ear pain, facial swelling, hearing loss, mouth sores, nosebleeds, rhinorrhea, sore throat, tinnitus and trouble swallowing.   Eyes:  Negative for pain and visual disturbance.  Respiratory:  Negative for cough, shortness of breath, wheezing and stridor.   Cardiovascular:  Negative for chest pain and palpitations.  Gastrointestinal:  Negative for abdominal distention, abdominal pain, diarrhea, nausea and vomiting.  Endocrine: Negative for polydipsia and polyuria.  Genitourinary:  Negative for dysuria and hematuria.  Musculoskeletal:  Negative for arthralgias, back pain, myalgias, neck pain and neck stiffness.  Skin:  Negative for color change and rash.  Neurological:  Negative for seizures and  syncope.  All other systems reviewed and are negative.  Physical Exam Updated Vital Signs BP (!) 122/55 (BP Location: Right Arm)   Pulse (!) 58   Temp 98.7 F (37.1 C) (Oral)   Resp 14   Ht 5\' 2"  (1.575 m)   Wt 77.1 kg   SpO2 98%   BMI 31.09 kg/m   Physical Exam Vitals and nursing note reviewed.  Constitutional:      General: She is not in acute distress.    Appearance: She is well-developed.  HENT:     Head: Normocephalic and atraumatic.     Right Ear: External ear normal.     Left Ear: External ear normal.     Nose: Nose normal.     Mouth/Throat:     Lips: No lesions.     Mouth: Mucous membranes are moist.     Tongue: No lesions.     Palate: No lesions.     Pharynx: Oropharynx is clear. Uvula midline. No pharyngeal swelling, oropharyngeal exudate, posterior oropharyngeal erythema or uvula swelling.      Tonsils: No tonsillar exudate or tonsillar abscesses.     Comments: Please see picture below of her right tonsil Eyes:     Extraocular Movements: Extraocular movements intact.     Conjunctiva/sclera: Conjunctivae normal.     Pupils: Pupils are equal, round, and reactive to light.  Cardiovascular:     Rate and Rhythm: Normal rate and regular rhythm.     Heart sounds: No murmur heard. Pulmonary:     Effort: Pulmonary effort is normal. No respiratory distress.     Breath sounds: Normal breath sounds.  Abdominal:     Palpations: Abdomen is soft.     Tenderness: There is no abdominal tenderness. There is no guarding.  Musculoskeletal:        General: Normal range of motion.     Cervical back: Normal range of motion and neck supple.     Right lower leg: No edema.     Left lower leg: No edema.  Skin:    General: Skin is warm and dry.  Neurological:     General: No focal deficit present.     Mental Status: She is alert and oriented to person, place, and time. Mental status is at baseline.     Cranial Nerves: No cranial nerve deficit.     Sensory: No sensory deficit.     Motor: No weakness.     Gait: Gait normal.  Psychiatric:        Mood and Affect: Mood normal.        Behavior: Behavior normal.        Thought Content: Thought content normal.        Judgment: Judgment normal.       ED Results / Procedures / Treatments   Labs (all labs ordered are listed, but only abnormal results are displayed) Labs Reviewed - No data to display  EKG None  Radiology No results found.  Procedures Procedures   Medications Ordered in ED Medications - No data to display  ED Course  I have reviewed the triage vital signs and the nursing notes.  Pertinent labs & imaging results that were available during my care of the patient were reviewed by me and considered in my medical decision making (see chart for details).    MDM Rules/Calculators/A&P  Patient is a  25 year old female coming in with right tonsil with left tonsil.  On evaluation of her tonsil there are no signs of peritonsillar abscess or retropharyngeal abscess.  Patient has no fever, sore throat, difficulty swallowing, changes in voice.  Patient's airway is intact.  There is a picture above that shows what the right tonsil looks like.  Patient has no infectious symptoms at this time.  Patient is not requiring any emergent labs or imaging.  Patient states compliance and understanding of plan.  Patient will follow up with PCP and ENT for further evaluation. Final Clinical Impression(s) / ED Diagnoses Final diagnoses:  Abnormality of soft tissue on examination    Rx / DC Orders ED Discharge Orders          Ordered    Ambulatory referral to ENT       Comments: Ambulatory referral to [facility] ENT for abnormal tissue on tonsil.   11/12/20 1827             Lottie Dawson, MD 11/13/20 6063    Tegeler, Canary Brim, MD 11/13/20 [phone]

## 2020-11-12 NOTE — ED Triage Notes (Signed)
Pt reports yesterday she noticed something hanging off her right side of her tonsil, pink colored. Denies pain or fevers

## 2020-11-12 NOTE — Discharge Instructions (Signed)
Your exam is consistent with abnormal tissue that could related to your tonsil stone. However, it looks slightly abnormal. Please follow up with [facility] ENT. If you have any swelling, fever, difficulty swallowing please return to the ED.

## 2020-12-19 IMAGING — US US MFM OB FOLLOW-UP
1 series · 13 of 28 positions shown · non-contrast
Comparison: none

[Series 1: us mfm ob follow-up · 58 acquisitions, 13 frames shown]
[im 3/58]
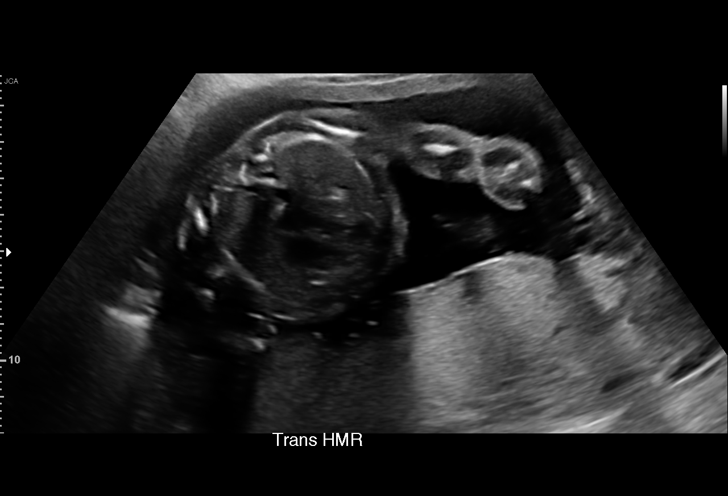
[im 7/58]
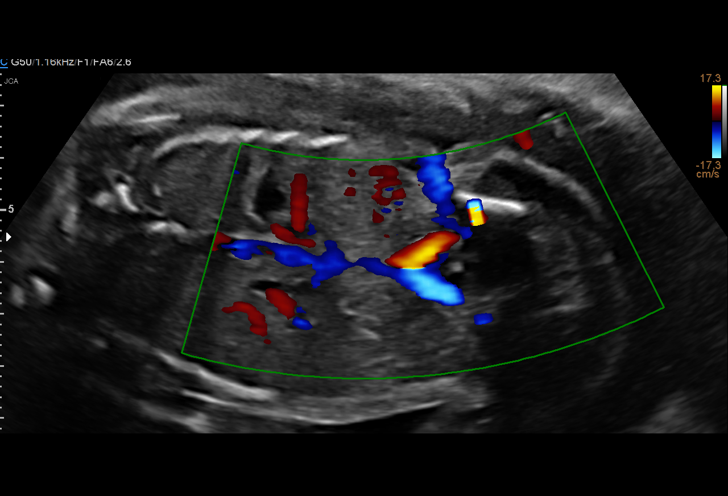
[im 11/58]
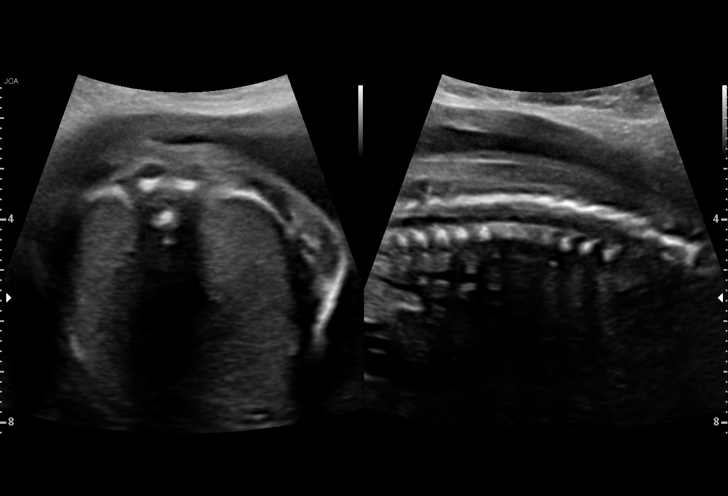
[im 15/58]
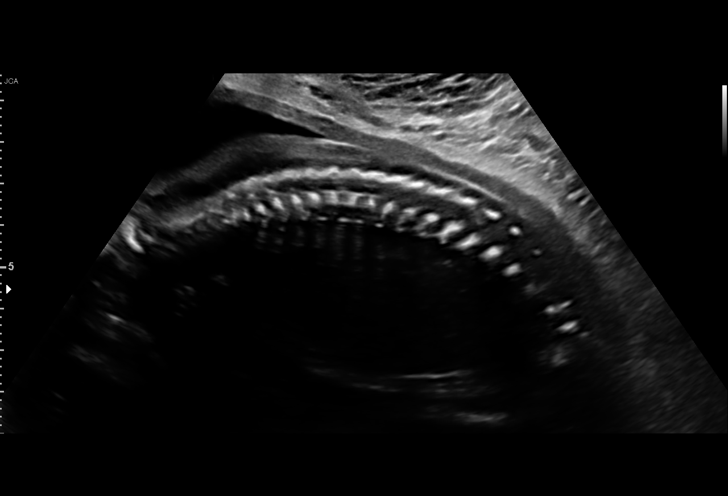
[im 20/58]
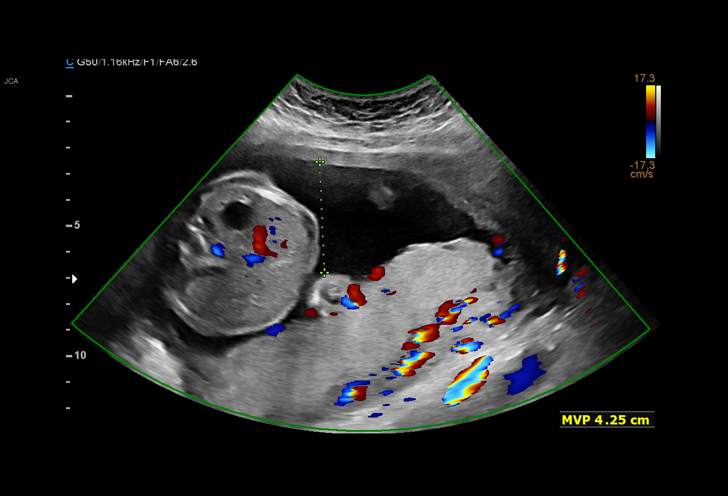
[im 24/58]
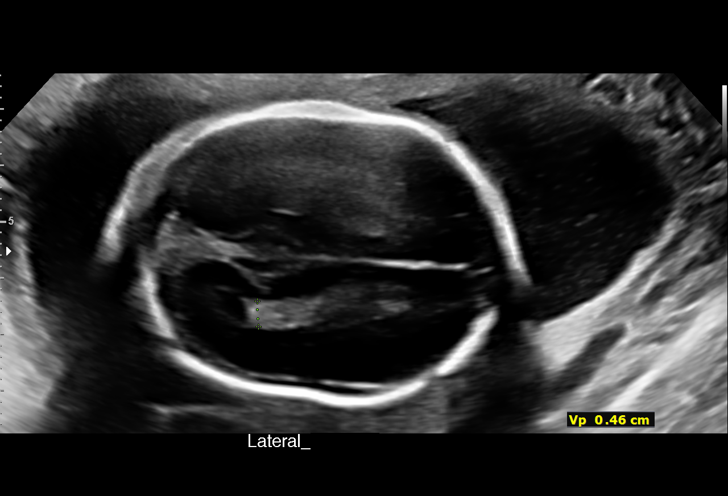
[im 30/58]
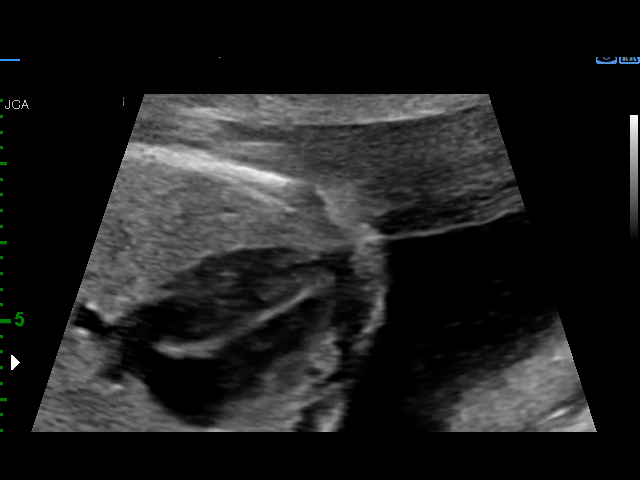
[im 34/58]
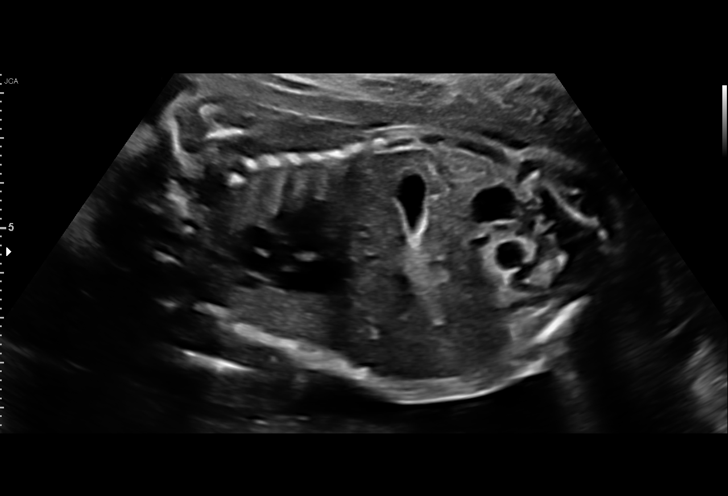
[im 39/58]
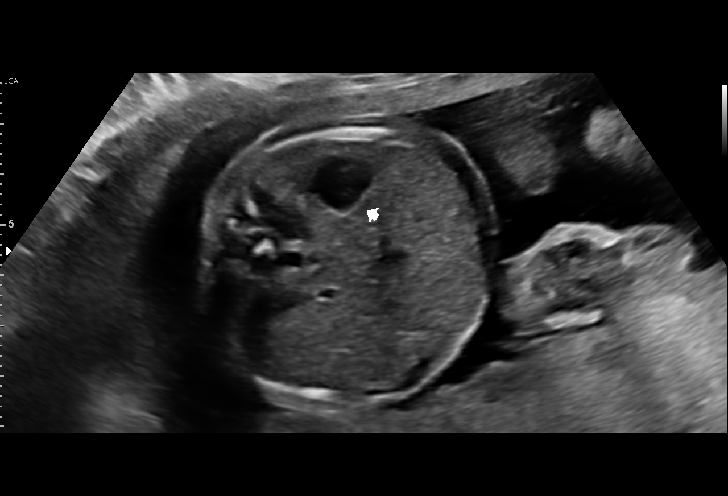
[im 43/58]
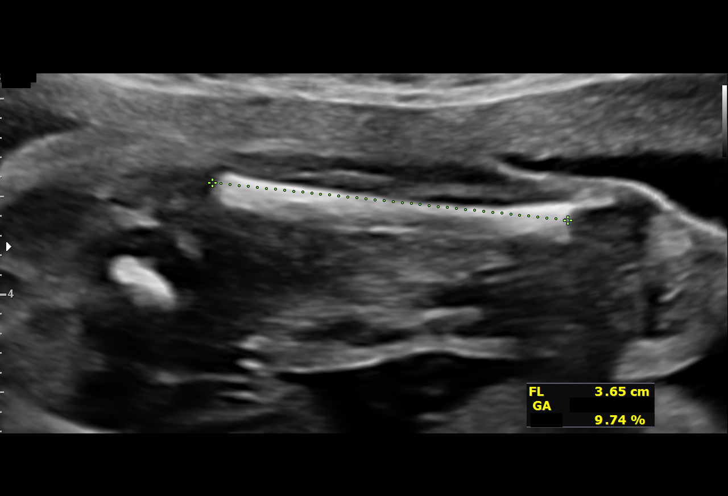
[im 47/58]
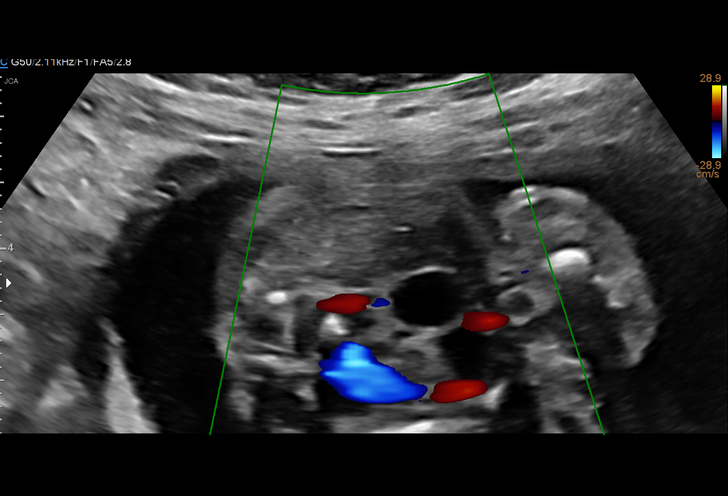
[im 51/58]
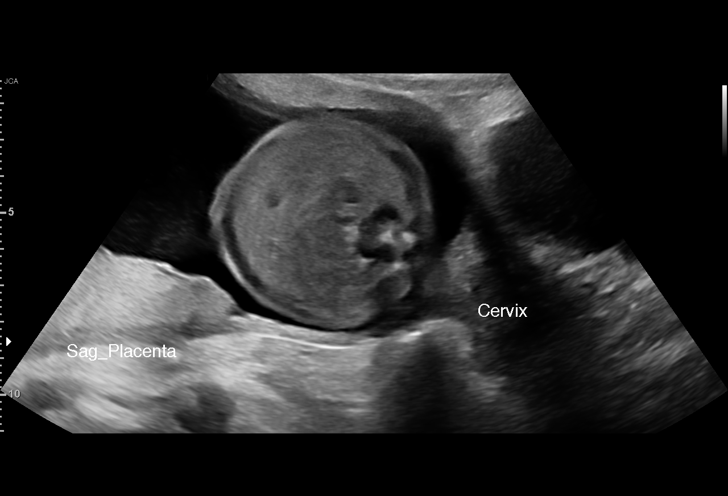
[im 55/58]
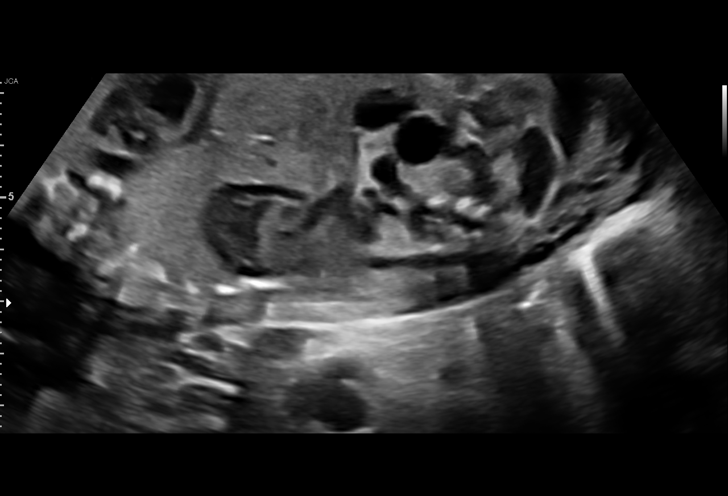

[13 of 28 positions shown; findings below may reference images not displayed]

Indications

 Encounter for antenatal screening for
 malformations
 Congenital anomaly of fetal kidney (left
 pelvic vs absent kidney)
 Obesity complicating pregnancy, second
 trimester (BMI 36)
 22 weeks gestation of pregnancy
Vital Signs

                                                Height:        5'2"
Fetal Evaluation

 Num Of Fetuses:         1
 Fetal Heart Rate(bpm):  140
 Cardiac Activity:       Observed
 Presentation:           Transverse, head to maternal right
 Placenta:               Posterior
 P. Cord Insertion:      Visualized

 Amniotic Fluid
 AFI FV:      Within normal limits

                             Largest Pocket(cm)

Biometry
 BPD:      52.8  mm     G. Age:  22w 0d         21  %    CI:        65.84   %    70 - 86
                                                         FL/HC:      17.2   %    19.2 -
 HC:      208.8  mm     G. Age:  23w 0d         47  %    HC/AC:      1.10        1.05 -
 AC:      189.3  mm     G. Age:  23w 5d         73  %    FL/BPD:     68.2   %    71 - 87
 FL:         36  mm     G. Age:  21w 3d          7  %    FL/AC:      19.0   %    20 - 24
 HUM:      35.8  mm     G. Age:  22w 3d         37  %

 LV:        4.6  mm

 Est. FW:     525  gm      1 lb 3 oz     41  %
OB History

 Gravidity:    2         Term:   1
 Living:       1
Gestational Age

 LMP:           22w 5d        Date:  06/01/19                 EDD:   03/07/20
 U/S Today:     22w 4d                                        EDD:   03/08/20
 Best:          22w 5d     Det. By:  LMP  (06/01/19)          EDD:   03/07/20
Anatomy

 Cranium:               Appears normal         Aortic Arch:            Previously seen
 Cavum:                 Appears normal         Ductal Arch:            Previously seen
 Ventricles:            Appears normal         Diaphragm:              Appears normal
 Choroid Plexus:        Previously seen        Stomach:                Appears normal, left
                                                                       sided
 Cerebellum:            Previously seen        Abdomen:                Appears normal
 Posterior Fossa:       Previously seen        Abdominal Wall:         Previously seen
 Nuchal Fold:           Not applicable (>20    Cord Vessels:           Previously seen
                        wks GA)
 Face:                  Orbits and profile     Kidneys:                Abnormal, Lt Kid
                        previously seen
 Lips:                  Previously seen        Bladder:                Appears normal
 Thoracic:              Appears normal         Spine:                  Limited views
                                                                       appear normal
 Heart:                 Appears normal         Upper Extremities:      Previously seen
                        (4CH, axis, and
                        situs)
 RVOT:                  Previously seen        Lower Extremities:      Previously seen
 LVOT:                  Previously seen

 Other:  Parents do not wish to know sex of fetus. Heels and 5th digit
         visualized. Nasal bone visualized. Technically difficult due to fetal
         position.
Impression

 Patient with diagnosis of absent left kidney (her pelvic kidney)
 return for ultrasound evaluation.
 Amniotic fluid is normal and good fetal activity seen.  Fetal
 growth is appropriate for gestational age.  Right kidney is
 normally located and appears normal.  Left kidney appears
 lower (possibly pelvic) and has multiple cysts.  Differential
 diagnosis includes multicystic dysplastic kidney.  Dilated fetal
 bowel is unlikely because of absence of peristalsis.

 I have reassured the patient of normal functioning right
 kidney (normal amniotic fluid).

 Kidneys are better evaluated with advancing gestation
 because of the presence of perinephric fat.  I also informed
 the patient that only postnatal evaluation will definitely be
 diagnosed the condition.  In the absence of oligohydramnios,
 early delivery is not indicated.
Recommendations

 -An appointment was made for her to return in 6 weeks for
 fetal growth and renal assessments.
                 Bitar, Kviria

## 2021-02-05 ENCOUNTER — Other Ambulatory Visit: Payer: Self-pay

## 2021-02-05 ENCOUNTER — Ambulatory Visit: Payer: Medicaid Other | Admitting: Family Medicine

## 2021-02-05 ENCOUNTER — Encounter: Payer: Self-pay | Admitting: Nurse Practitioner

## 2021-02-05 ENCOUNTER — Ambulatory Visit (INDEPENDENT_AMBULATORY_CARE_PROVIDER_SITE_OTHER): Payer: Medicaid Other | Admitting: Nurse Practitioner

## 2021-02-05 VITALS — BP 122/68 | HR 79 | Temp 98.1°F | Ht 62.0 in | Wt 167.0 lb

## 2021-02-05 DIAGNOSIS — R6889 Other general symptoms and signs: Secondary | ICD-10-CM | POA: Diagnosis not present

## 2021-02-05 DIAGNOSIS — J069 Acute upper respiratory infection, unspecified: Secondary | ICD-10-CM | POA: Diagnosis not present

## 2021-02-05 LAB — POCT RAPID STREP A (OFFICE): Rapid Strep A Screen: NEGATIVE

## 2021-02-05 LAB — POCT INFLUENZA A/B
Influenza A, POC: NEGATIVE
Influenza B, POC: NEGATIVE

## 2021-02-05 MED ORDER — BENZONATATE 100 MG PO CAPS: 100.0000 mg | ORAL_CAPSULE | Freq: Two times a day (BID) | ORAL | 0 refills | Status: AC | PRN

## 2021-02-05 NOTE — Patient Instructions (Signed)
You were seen today in the [facility] for flu like symptoms. Labs were collected, results will be available via MyChart or, if abnormal, you will be contacted by clinic staff. You were prescribed medications, please take as directed. Please follow up in 1-2 wks as needed if symptoms worsen or progress.

## 2021-02-05 NOTE — Progress Notes (Signed)
[facility] Patient [facility] [address] [place], [place]  [address] Phone:  [phone]   Fax:  [phone] Subjective:   Patient ID: Stephanie Simon, female    DOB: July 14, 1995, 25 y.o.   MRN: [id number]  Chief Complaint  Patient presents with   Establish Care    Daughters are currently sick, she is currently having sore throat, cough, congestion, head pressure started Sunday.   HPI Natalea Sutliff 25 y.o. female with no significant medical history to the [facility] for flu like symptoms x 2-3 days. Patient currently complaining of sore throat, cough, congestion and head pressure. Cough is non productive. Endorses known sick contacts, daughters recently diagnosed with sinus infection and ear infection.   Currently rates throat pain 4/10 and describes as soreness. Has been taking Alka Seltzer for symptoms with mild improvement, states, "it mostly, just helps me sleep. " Endorses chest pain with cough only, denies any shortness of breath. Verbalizes having significant nasal congestion with clear/ green drainage with red specks, has been blowing nose frequently.   Denies any other complaints today. Denies any fatigue, chest pain, shortness of breath, HA or dizziness. Denies any blurred vision, numbness or tingling.   Past Medical History:  Diagnosis Date   Cardiac murmur 03/28/2013   ECHO 05/13/2013 NORMAL ECHO.  Benign Flow murmur. Heard best in the aortic space and LEFT carotid.  Increases with increased pressure.    Menorrhagia with regular cycle 09/14/2018    Past Surgical History:  Procedure Laterality Date   NO PAST SURGERIES     WISDOM TOOTH EXTRACTION  06/2015    Family History  Problem Relation Age of Onset   Cancer Paternal Aunt    Anemia Mother    Diabetes Father    Arthritis Father        gout   Anemia Father    Hypertension Father    Heart disease Maternal Grandfather     Social History   Socioeconomic History   Marital status: Single    Spouse name: n/a    Number of children: 0   Years of education: Not on file   Highest education level: Not on file  Occupational History   Occupation: student    Comment: [employer]  Tobacco Use   Smoking status: Never   Smokeless tobacco: Never  Vaping Use   Vaping Use: Never used  Substance and Sexual Activity   Alcohol use: No   Drug use: No   Sexual activity: Yes    Partners: Male    Birth control/protection: None  Other Topics Concern   Not on file  Social History Narrative   Lives with her parents.  She has 7 half-sisters and one half-brother.   Social Determinants of Health   Financial Resource Strain: Not on file  Food Insecurity: Not on file  Transportation Needs: Not on file  Physical Activity: Not on file  Stress: Not on file  Social Connections: Not on file  Intimate Partner Violence: Not on file    Outpatient Medications Prior to Visit  Medication Sig Dispense Refill   paragard intrauterine copper IUD IUD 1 each by Intrauterine route once.     Prenatal Vit-Fe Fumarate-FA (PRENATAL VITAMIN PO) Take by mouth.     No facility-administered medications prior to visit.    No Known Allergies  Review of Systems  Constitutional:  Positive for malaise/fatigue. Negative for chills and fever.  HENT:  Positive for congestion, sinus pain and sore throat.  Respiratory:  Positive for cough and sputum production. Negative for shortness of breath.   Cardiovascular:  Positive for chest pain. Negative for palpitations and leg swelling.  Gastrointestinal:  Negative for abdominal pain, blood in stool, constipation, diarrhea, nausea and vomiting.  Musculoskeletal: Negative.   Skin: Negative.   Neurological: Negative.   Psychiatric/Behavioral:  Negative for depression. The patient is not nervous/anxious.   All other systems reviewed and are negative.     Objective:    Physical Exam Constitutional:      General: She is not in acute distress.    Appearance: Normal appearance.   HENT:     Head: Normocephalic.     Right Ear: Tympanic membrane, ear canal and external ear normal.     Left Ear: Tympanic membrane, ear canal and external ear normal.     Nose: Congestion present. No rhinorrhea.     Mouth/Throat:     Mouth: Mucous membranes are moist.     Pharynx: Oropharynx is clear.  Eyes:     Extraocular Movements: Extraocular movements intact.     Conjunctiva/sclera: Conjunctivae normal.     Pupils: Pupils are equal, round, and reactive to light.  Cardiovascular:     Rate and Rhythm: Normal rate and regular rhythm.     Pulses: Normal pulses.     Heart sounds: Normal heart sounds.     Comments: No obvious peripheral edema Pulmonary:     Effort: Pulmonary effort is normal.     Breath sounds: Normal breath sounds.  Musculoskeletal:        General: Normal range of motion.     Cervical back: Normal range of motion and neck supple.  Skin:    General: Skin is warm and dry.     Capillary Refill: Capillary refill takes less than 2 seconds.  Neurological:     General: No focal deficit present.     Mental Status: She is alert and oriented to person, place, and time.  Psychiatric:        Mood and Affect: Mood normal.        Behavior: Behavior normal.        Thought Content: Thought content normal.        Judgment: Judgment normal.    BP 122/68 (BP Location: Right Arm, Patient Position: Sitting)   Pulse 79   Temp 98.1 F (36.7 C)   Ht 5\' 2"  (1.575 m)   Wt 167 lb 0.2 oz (75.8 kg)   LMP 12/25/2020   SpO2 98%   Breastfeeding Yes   BMI 30.55 kg/m  Wt Readings from Last 3 Encounters:  02/05/21 167 lb 0.2 oz (75.8 kg)  11/12/20 170 lb (77.1 kg)  04/10/20 183 lb 3.2 oz (83.1 kg)    Immunization History  Administered Date(s) Administered   HPV Quadrivalent 03/28/2013   Influenza,inj,Quad PF,6+ Mos 03/28/2013, 01/07/2017, 12/28/2018, 01/02/2020   Meningococcal Conjugate 03/28/2013   Tdap 12/10/2016, 12/15/2019    Diabetic Foot Exam - Simple   No data  filed     Lab Results  Component Value Date   TSH 1.520 10/08/2018   Lab Results  Component Value Date   WBC 16.1 (H) 03/11/2020   HGB 9.7 (L) 03/11/2020   HCT 33.7 (L) 03/11/2020   MCV 68.6 (L) 03/11/2020   PLT 327 03/11/2020   Lab Results  Component Value Date   NA 136 03/11/2020   K 4.0 03/11/2020   CO2 20 (L) 03/11/2020   GLUCOSE 93 03/11/2020   BUN  10 03/11/2020   CREATININE 0.66 03/11/2020   BILITOT 0.4 03/11/2020   ALKPHOS 184 (H) 03/11/2020   AST 22 03/11/2020   ALT 20 03/11/2020   PROT 6.8 03/11/2020   ALBUMIN 2.7 (L) 03/11/2020   CALCIUM 9.0 03/11/2020   ANIONGAP 12 03/11/2020   No results found for: CHOL No results found for: HDL No results found for: LDLCALC No results found for: TRIG No results found for: CHOLHDL Lab Results  Component Value Date   HGBA1C 5.2 08/15/2019       Assessment & Plan:   Problem List Items Addressed This Visit   None Visit Diagnoses     Flu-like symptoms    -  Primary   Relevant Medications   benzonatate (TESSALON) 100 MG capsule   Other Relevant Orders   Rapid Strep A (Completed): negative    Influenza A/B (Completed): negative    Upper respiratory tract infection, unspecified type       Relevant Medications   benzonatate (TESSALON) 100 MG capsule   Other Relevant Orders   Rapid Strep A (Completed)   Influenza A/B (Completed) Encouraged to maintain hydration Informed to continue taking OTC medications as needed for symptoms   Follow up in 1-2 wks as needed if symptoms worsen or do not improve    I have discontinued [employer]'s Prenatal Vit-Fe Fumarate-FA (PRENATAL VITAMIN PO). I am also having her start on benzonatate. Additionally, I am having her maintain her paragard intrauterine copper.  Meds ordered this encounter  Medications   benzonatate (TESSALON) 100 MG capsule    Sig: Take 1 capsule (100 mg total) by mouth 2 (two) times daily as needed for cough.    Dispense:  20 capsule    Refill:  0      Kathrynn Speed, NP

## 2022-12-16 ENCOUNTER — Encounter: Payer: Self-pay | Admitting: Obstetrics and Gynecology

## 2022-12-16 ENCOUNTER — Ambulatory Visit: Payer: Medicaid Other | Admitting: Obstetrics and Gynecology

## 2022-12-16 ENCOUNTER — Other Ambulatory Visit (HOSPITAL_COMMUNITY)
Admission: RE | Admit: 2022-12-16 | Discharge: 2022-12-16 | Disposition: A | Discharge status: Home / Self Care | Payer: Medicaid Other | Source: Ambulatory Visit | Attending: Obstetrics and Gynecology | Admitting: Obstetrics and Gynecology

## 2022-12-16 VITALS — BP 119/76 | HR 67 | Ht 62.0 in | Wt 154.4 lb

## 2022-12-16 DIAGNOSIS — Z01419 Encounter for gynecological examination (general) (routine) without abnormal findings: Secondary | ICD-10-CM | POA: Diagnosis present

## 2022-12-16 DIAGNOSIS — Z113 Encounter for screening for infections with a predominantly sexual mode of transmission: Secondary | ICD-10-CM | POA: Diagnosis present

## 2022-12-16 DIAGNOSIS — Z30431 Encounter for routine checking of intrauterine contraceptive device: Secondary | ICD-10-CM

## 2022-12-16 NOTE — Progress Notes (Signed)
Obstetrics and Gynecology New Patient Evaluation  Appointment Date: 12/16/2022  OB[facility]N Clinic: [facility]  Chief Complaint:  Chief Complaint  Patient presents with   Annual Exam  Couldn't feel IUD strings  History of Present Illness: Stephanie Simon is a 27 y.o.  [phone] (Patient's last menstrual period was 11/26/2022 (approximate).), seen for the above chief complaint. Her past medical history is significant for paragard placed Nov 2021 after her last baby.  No problems or issues except she decided to check the IUD strings a few months ago, to see if she could feel them, and she couldn't feel them.    Review of Systems: Pertinent items noted in HPI and remainder of comprehensive ROS otherwise negative.   Past Medical History:  Past Medical History:  Diagnosis Date   Cardiac murmur 03/28/2013   ECHO 05/13/2013 NORMAL ECHO.  Benign Flow murmur. Heard best in the aortic space and LEFT carotid.  Increases with increased pressure.    Menorrhagia with regular cycle 09/14/2018    Past Surgical History:  Past Surgical History:  Procedure Laterality Date   NO PAST SURGERIES     WISDOM TOOTH EXTRACTION  06/2015    Past Obstetrical History:  OB History  Gravida Para Term Preterm AB Living  2 2 2     2   SAB IAB Ectopic Multiple Live Births        0 2    # Outcome Date GA Lbr Len/2nd Weight Sex Type Anes PTL Lv  2 Term 03/11/20 106w4d 03:12 / 00:09 9 lb 13.3 oz (4.459 kg) F Vag-Spont Spinal  LIV  1 Term 03/10/17 [redacted]w[redacted]d 20:45 / 02:21 9 lb 3.8 oz (4.19 kg) F Vag-Spont EPI  LIV    Past Gynecological History: As per HPI. Periods: qmonth, regular, <1wk, not heavy or painful History of Pap Smear(s): Yes.   Last pap 2020, which was wnl  Social History:  Social History   Socioeconomic History   Marital status: Single    Spouse name: n/a   Number of children: 0   Years of education: Not on file   Highest education level: Not on file  Occupational History    Occupation: student    Comment: [employer]  Tobacco Use   Smoking status: Never   Smokeless tobacco: Never  Vaping Use   Vaping status: Never Used  Substance and Sexual Activity   Alcohol use: No   Drug use: No   Sexual activity: Yes    Partners: Male    Birth control/protection: None  Other Topics Concern   Not on file  Social History Narrative   Lives with her parents.  She has 7 half-sisters and one half-brother.   Social Determinants of Health   Financial Resource Strain: Not on file  Food Insecurity: Not on file  Transportation Needs: Not on file  Physical Activity: Not on file  Stress: Not on file  Social Connections: Not on file  Intimate Partner Violence: Not on file    Family History:  Family History  Problem Relation Age of Onset   Cancer Paternal Aunt    Anemia Mother    Diabetes Father    Arthritis Father        gout   Anemia Father    Hypertension Father    Heart disease Maternal Grandfather     Medications Stephanie Simon had no medications administered during this visit. Current Outpatient Medications  Medication Sig Dispense Refill   benzonatate (TESSALON) 100 MG  capsule Take 1 capsule (100 mg total) by mouth 2 (two) times daily as needed for cough. 20 capsule 0   paragard intrauterine copper IUD IUD 1 each by Intrauterine route once.     No current facility-administered medications for this visit.    Allergies Patient has no known allergies.   Physical Exam:  BP 119/76   Pulse 67   Ht 5\' 2"  (1.575 m)   Wt 154 lb 6.4 oz (70 kg)   LMP 11/26/2022 (Approximate) Comment: Roughly 8/  BMI 28.24 kg/m  Body mass index is 28.24 kg/m. General appearance: Well nourished, well developed female in no acute distress.  Neck:  Supple, normal appearance, and no thyromegaly  Respiratory: Normal respiratory effort Abdomen: no masses, hernias; diffusely non tender to palpation, non distended Breasts: no s/s. Neuro/Psych:  Normal mood and affect.   Skin:  Warm and dry.  Lymphatic:  No inguinal lymphadenopathy.   Cervical exam performed in the presence of a chaperone Pelvic exam: is not limited by body habitus EGBUS: within normal limits Vagina: within normal limits and with no blood or discharge in the vault Cervix: normal appearing cervix without tenderness, discharge or lesions. IUD strings 3-4cm Uterus:  nonenlarged and non tender Adnexa:  normal adnexa and no mass, fullness, tenderness Rectovaginal: deferred  Laboratory: none  Radiology: none  Assessment: patient doing well  Plan:  1. Well woman exam with routine gynecological exam Routine care - Cytology - PAP - RPR+HBsAg+HCVAb+...  2. Screen for STD (sexually transmitted disease) - Cytology - PAP - RPR+HBsAg+HCVAb+...  3. IUD check up No issues.   RTC PRN  Cornelia Copa MD Attending [facility])

## 2022-12-16 NOTE — Progress Notes (Signed)
Patient presents for Annual.  LMP: 11/26/22 roughly  Last pap: Date: 2020  Contraception: IUD: Paraguard Mammogram: Not yet indicated STD Screening: Accepts Flu Vaccine : Declines  CC:  Annual   Stating that she is unable to find her iud strings herself  Pt also wanting std screening on pap and blood work today

## 2022-12-17 LAB — RPR+HBSAG+HCVAB+...
HIV Screen 4th Generation wRfx: NONREACTIVE
Hep C Virus Ab: NONREACTIVE
Hepatitis B Surface Ag: NEGATIVE
RPR Ser Ql: NONREACTIVE

## 2022-12-18 ENCOUNTER — Other Ambulatory Visit: Payer: Self-pay | Admitting: *Deleted

## 2022-12-18 ENCOUNTER — Encounter: Payer: Self-pay | Admitting: Obstetrics and Gynecology

## 2022-12-18 LAB — CYTOLOGY - PAP
Chlamydia: POSITIVE — AB
Comment: NEGATIVE
Comment: NEGATIVE
Comment: NORMAL
Diagnosis: NEGATIVE
Neisseria Gonorrhea: NEGATIVE
Trichomonas: NEGATIVE

## 2022-12-18 MED ORDER — DOXYCYCLINE HYCLATE 100 MG PO CAPS: 100.0000 mg | ORAL_CAPSULE | Freq: Two times a day (BID) | ORAL | 0 refills | Status: AC

## 2022-12-23 ENCOUNTER — Encounter: Payer: Self-pay | Admitting: Obstetrics and Gynecology

## 2022-12-23 DIAGNOSIS — A749 Chlamydial infection, unspecified: Secondary | ICD-10-CM | POA: Insufficient documentation

## 2023-01-13 ENCOUNTER — Ambulatory Visit: Payer: Medicaid Other | Admitting: Obstetrics and Gynecology

## 2023-01-13 ENCOUNTER — Encounter: Payer: Self-pay | Admitting: Obstetrics and Gynecology

## 2023-01-13 ENCOUNTER — Other Ambulatory Visit (HOSPITAL_COMMUNITY)
Admission: RE | Admit: 2023-01-13 | Discharge: 2023-01-13 | Disposition: A | Discharge status: Home / Self Care | Payer: Medicaid Other | Source: Ambulatory Visit | Attending: Obstetrics and Gynecology | Admitting: Obstetrics and Gynecology

## 2023-01-13 VITALS — BP 112/71 | HR 56 | Wt 154.2 lb

## 2023-01-13 DIAGNOSIS — Z8619 Personal history of other infectious and parasitic diseases: Secondary | ICD-10-CM

## 2023-01-13 MED ORDER — VALACYCLOVIR HCL 1 G PO TABS: 1000.0000 mg | ORAL_TABLET | Freq: Every day | ORAL | 0 refills | Status: AC

## 2023-01-13 NOTE — Progress Notes (Signed)
Obstetrics and Gynecology Visit Return Patient Evaluation  Appointment Date: 01/13/2023  Primary Care Provider: Kathrynn Speed (Inactive)  OB[facility] Clinic: [facility]  Chief Complaint: STD questions  History of Present Illness:  Stephanie Simon is a 27 y.o. with above CC. Patieht had annual on 9/3 and had routine pap and STD screening. +Chlamydia with negative pap and other STD swab and blood testing. Patient states she took the abx fine and her partner was treated appropriately  Interval History: Since that time, she states that she had no AUB or pain. She has questions about PID, chlamydia and her h/o cold sores.   Review of Systems: as noted in the History of Present Illness.   Patient Active Problem List   Diagnosis Date Noted   Chlamydia 12/23/2022   Paragard IUD (intrauterine device) in place since 03/11/2020 03/11/2020   BMI 30s 08/15/2019   Anxiety and depression 08/15/2019   Medications:  Stephanie Simon had no medications administered during this visit. Current Outpatient Medications  Medication Sig Dispense Refill   paragard intrauterine copper IUD IUD 1 each by Intrauterine route once.     valACYclovir (VALTREX) 1000 MG tablet Take 1 tablet (1,000 mg total) by mouth daily for 5 days. Start these when you feel symptoms are starting 15 tablet 0   benzonatate (TESSALON) 100 MG capsule Take 1 capsule (100 mg total) by mouth 2 (two) times daily as needed for cough. (Patient not taking: Reported on 01/13/2023) 20 capsule 0   No current facility-administered medications for this visit.    Allergies: has No Known Allergies.  Physical Exam:  BP 112/71   Pulse (!) 56   Wt 154 lb 3.2 oz (69.9 kg)   LMP 11/26/2022 (Approximate) Comment: Roughly 8/  BMI 28.20 kg/m  Body mass index is 28.2 kg/m. General appearance: Well nourished, well developed female in no acute distress.  Neuro/Psych:  Normal mood and affect.    Assessment: patient doing  well  Plan:  1. History of chlamydia infection Patient made appointment for today, and I told her that it may be too soon to re-test as the test is very sensitive and may come back falsely positive. I offered a self swab nurse visit in a few weeks, but she'd rather just go ahead with the self swab from today.   I d/w her re: what PID is and STDs, abstinence and barrier contraception, etc.  - Cervicovaginal ancillary only  2. 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 PRN valtrex sent in   RTC: PRN  Cornelia Copa MD Attending [facility])

## 2023-01-13 NOTE — Progress Notes (Signed)
CC: TOC for Chlamydia that she tested positive for roughly 4 weeks ago

## 2023-01-14 LAB — CERVICOVAGINAL ANCILLARY ONLY
Bacterial Vaginitis (gardnerella): POSITIVE — AB
Candida Glabrata: NEGATIVE
Candida Vaginitis: NEGATIVE
Chlamydia: NEGATIVE
Comment: NEGATIVE
Comment: NEGATIVE
Comment: NEGATIVE
Comment: NEGATIVE
Comment: NEGATIVE
Comment: NORMAL
Neisseria Gonorrhea: NEGATIVE
Trichomonas: NEGATIVE

## 2023-01-16 MED ORDER — METRONIDAZOLE 500 MG PO TABS: 500.0000 mg | ORAL_TABLET | Freq: Two times a day (BID) | ORAL | 0 refills | Status: AC

## 2023-01-16 NOTE — Addendum Note (Signed)
Addended by: Mecosta Bing on: 01/16/2023 02:17 AM   Modules accepted: Orders
